# Patient Record
Sex: Male | Born: 1995 | Race: Black or African American | Hispanic: No | Marital: Single | Smoking: Never smoker
Health system: Southern US, Community
[De-identification: ages and names within clinical notes are randomized; demographics above are authoritative.]

## PROBLEM LIST (undated history)

## (undated) ENCOUNTER — Emergency Department: Admission: EM | Payer: Self-pay | Source: Home / Self Care

---
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
## 2018-03-02 ENCOUNTER — Emergency Department (HOSPITAL_BASED_OUTPATIENT_CLINIC_OR_DEPARTMENT_OTHER): Payer: Federal, State, Local not specified - PPO

## 2018-03-02 ENCOUNTER — Other Ambulatory Visit: Payer: Self-pay

## 2018-03-02 ENCOUNTER — Encounter (HOSPITAL_BASED_OUTPATIENT_CLINIC_OR_DEPARTMENT_OTHER): Payer: Self-pay | Admitting: *Deleted

## 2018-03-02 ENCOUNTER — Emergency Department (HOSPITAL_BASED_OUTPATIENT_CLINIC_OR_DEPARTMENT_OTHER)
Admission: EM | Admit: 2018-03-02 | Discharge: 2018-03-02 | Disposition: A | Discharge status: Home / Self Care | Payer: Federal, State, Local not specified - PPO | Attending: Emergency Medicine | Admitting: Emergency Medicine

## 2018-03-02 DIAGNOSIS — S199XXA Unspecified injury of neck, initial encounter: Secondary | ICD-10-CM | POA: Diagnosis present

## 2018-03-02 DIAGNOSIS — Y999 Unspecified external cause status: Secondary | ICD-10-CM | POA: Diagnosis not present

## 2018-03-02 DIAGNOSIS — Y92410 Unspecified street and highway as the place of occurrence of the external cause: Secondary | ICD-10-CM | POA: Insufficient documentation

## 2018-03-02 DIAGNOSIS — S161XXA Strain of muscle, fascia and tendon at neck level, initial encounter: Secondary | ICD-10-CM | POA: Diagnosis not present

## 2018-03-02 DIAGNOSIS — Y9389 Activity, other specified: Secondary | ICD-10-CM | POA: Insufficient documentation

## 2018-03-02 MED ORDER — NAPROXEN 500 MG PO TABS: 500.0000 mg | ORAL_TABLET | Freq: Two times a day (BID) | ORAL | 0 refills | Status: DC

## 2018-03-02 NOTE — ED Notes (Signed)
ED Provider at bedside. 
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
## 2018-03-02 NOTE — Discharge Instructions (Signed)
Work note provided.  Would expect the soreness in the neck to improve over 7 days if it does not she will need follow-up.  Take the Naprosyn on a regular basis.  Return for any new or worse symptoms.  CT scan of the neck without any bony injuries.

## 2018-03-02 NOTE — ED Notes (Signed)
Pt to CT at this time.

## 2018-06-24 ENCOUNTER — Other Ambulatory Visit: Payer: Self-pay

## 2018-06-24 ENCOUNTER — Encounter (HOSPITAL_BASED_OUTPATIENT_CLINIC_OR_DEPARTMENT_OTHER): Payer: Self-pay | Admitting: Adult Health

## 2018-06-24 ENCOUNTER — Emergency Department (HOSPITAL_BASED_OUTPATIENT_CLINIC_OR_DEPARTMENT_OTHER)
Admission: EM | Admit: 2018-06-24 | Discharge: 2018-06-24 | Disposition: A | Discharge status: Home / Self Care | Payer: Federal, State, Local not specified - PPO | Attending: Emergency Medicine | Admitting: Emergency Medicine

## 2018-06-24 DIAGNOSIS — Z79899 Other long term (current) drug therapy: Secondary | ICD-10-CM | POA: Diagnosis not present

## 2018-06-24 DIAGNOSIS — R040 Epistaxis: Secondary | ICD-10-CM | POA: Diagnosis not present

## 2018-06-24 DIAGNOSIS — F17220 Nicotine dependence, chewing tobacco, uncomplicated: Secondary | ICD-10-CM | POA: Diagnosis not present

## 2018-06-24 DIAGNOSIS — J3489 Other specified disorders of nose and nasal sinuses: Secondary | ICD-10-CM | POA: Diagnosis not present

## 2018-06-24 DIAGNOSIS — R0981 Nasal congestion: Secondary | ICD-10-CM | POA: Insufficient documentation

## 2018-06-24 DIAGNOSIS — H04209 Unspecified epiphora, unspecified lacrimal gland: Secondary | ICD-10-CM | POA: Insufficient documentation

## 2018-06-24 MED ORDER — SALINE SPRAY 0.65 % NA SOLN: 1.0000 | NASAL | 0 refills | Status: AC | PRN

## 2018-06-24 MED ORDER — CETIRIZINE HCL 10 MG PO TABS: 10.0000 mg | ORAL_TABLET | Freq: Every day | ORAL | 0 refills | Status: AC

## 2018-06-24 NOTE — Discharge Instructions (Addendum)
You were seen in the emergency department today for allergies.  We are sending you home with 2 prescriptions to help with this: Zyrtec (oral pill to take daily) and Nasal saline spray (spray to use in each nostril as needed 2-3 times per day).   We have prescribed you new medication(s) today. Discuss the medications prescribed today with your pharmacist as they can have adverse effects and interactions with your other medicines including over the counter and prescribed medications. Seek medical evaluation if you start to experience new or abnormal symptoms after taking one of these medicines, seek care immediately if you start to experience difficulty breathing, feeling of your throat closing, facial swelling, or rash as these could be indications of a more serious allergic reaction  Please follow-up with your primary care provider within 1 week for reevaluation.  If you do not have a primary care provider you may call the phone number in your discharge instructions.  Return to the ER for new or worsening symptoms or any other concerns.

## 2018-06-24 NOTE — ED Triage Notes (Signed)
Presents with allergies and a nose bleed. Nose not currently bleeding at thist ime. HE reprots that blood comes out when he blows his nose and he has a lot of congestion.

## 2018-06-24 NOTE — ED Provider Notes (Signed)
[facility] EMERGENCY DEPARTMENT Provider Note   CSN: [id number] Arrival date & time: 06/24/18  1012     History   Chief Complaint Chief Complaint  Patient presents with  . Nasal Congestion    HPI Jacob Frey is a 22 y.o. male with a history of tobacco abuse who presents to the emergency department with complaints of nasal congestion over the past 24 hours.  Patient states he feels his allergies are acting up, and he states he is having congestion, rhinorrhea, and watering of the eyes.  He states that when he blows his nose or digitally manipulates that he sometimes has some bleeding as well, this is brief and has resolved spontaneously each time. He has tried benadryl/allegra without significant improvement. He reports these have problems that are similar in the past and improved with an unknown pill and nasal spray.  States he has been outside a lot and has had skin allergy testing which has been positive for multiple plan/trees.  Denies fever, sore throat, ear pain, cough, trouble breathing, lightheadedness, or dizziness.   HPI  History reviewed. No pertinent past medical history.  There are no active problems to display for this patient.   History reviewed. No pertinent surgical history.      Home Medications    Prior to Admission medications   Medication Sig Start Date End Date Taking? Authorizing Provider  HYDROcodone-acetaminophen (NORCO/VICODIN) 5-325 MG per tablet Take 1 tablet by mouth every 6 (six) hours as needed. 01/21/14   Mellody Drown, PA-C  ibuprofen (ADVIL,MOTRIN) 800 MG tablet Take 1 tablet (800 mg total) by mouth 3 (three) times daily. 01/21/14   Mellody Drown, PA-C  naproxen (NAPROSYN) 500 MG tablet Take 1 tablet (500 mg total) by mouth 2 (two) times daily. 03/02/18   Vanetta Mulders, MD    Family History History reviewed. No pertinent family history.  Social History Social History   Tobacco Use  . Smoking status: Current Every Day Smoker    . Smokeless tobacco: Current User  Substance Use Topics  . Alcohol use: Yes  . Drug use: No     Allergies   Patient has no known allergies.   Review of Systems Review of Systems  Constitutional: Negative for chills and fever.  HENT: Positive for congestion, nosebleeds and rhinorrhea. Negative for ear pain and sore throat.   Respiratory: Negative for cough and shortness of breath.   Cardiovascular: Negative for chest pain.  Neurological: Negative for dizziness, syncope and light-headedness.     Physical Exam Updated Vital Signs BP 124/81   Pulse 76   Temp 98.1 F (36.7 C) (Oral)   Resp 18   Ht 5\' 10"  (1.778 m)   Wt 103.4 kg   SpO2 100%   BMI 32.71 kg/m   Physical Exam  Constitutional: He appears well-developed and well-nourished.  Non-toxic appearance. No distress.  HENT:  Head: Normocephalic and atraumatic.  Right Ear: Tympanic membrane normal. Tympanic membrane is not perforated, not erythematous, not retracted and not bulging.  Left Ear: Tympanic membrane normal. Tympanic membrane is not perforated, not erythematous, not retracted and not bulging.  Nose: Mucosal edema (boggy turbinates) present. No nasal septal hematoma. No epistaxis. Right sinus exhibits no maxillary sinus tenderness and no frontal sinus tenderness. Left sinus exhibits no maxillary sinus tenderness and no frontal sinus tenderness.  Mouth/Throat: Uvula is midline and oropharynx is clear and moist. No oropharyngeal exudate or posterior oropharyngeal erythema.  Eyes: Pupils are equal, round, and reactive to light. Conjunctivae  and EOM are normal. Right eye exhibits no discharge. Left eye exhibits no discharge.  Neck: Normal range of motion. Neck supple. No neck rigidity.  Cardiovascular: Normal rate and regular rhythm.  Pulmonary/Chest: Effort normal and breath sounds normal.  Lymphadenopathy:    He has no cervical adenopathy.  Neurological: He is alert.  Clear speech.   Psychiatric: He has a  normal mood and affect. His behavior is normal. Thought content normal.  Nursing note and vitals reviewed.    ED Treatments / Results  Labs (all labs ordered are listed, but only abnormal results are displayed) Labs Reviewed - No data to display  EKG None  Radiology No results found.  Procedures Procedures (including critical care time)  Medications Ordered in ED Medications - No data to display   Initial Impression / Assessment and Plan / ED Course  I have reviewed the triage vital signs and the nursing notes.  Pertinent labs & imaging results that were available during my care of the patient were reviewed by me and considered in my medical decision making (see chart for details).   Patient presents to the emergency department with complaints of congestion/rhinorrhea, intermittent mild nasal bleeding, and watering of the eyes which are consistent with prior issues with allergies. Patient nontoxic appearing, in no apparent distress, vitals WNL.  No active epistaxis on exam, suspect this is due to irrigation and digital manipulation. Additionally do not suspect acute bacterial conjunctivitis, acute bacterial sinusitis, AOM/EOM, or strep based on history and exam. Will trial zyrtec and nasal saline spray with PCP follow up. I discussed treatment plan, need for PCP follow-up, and return precautions with the patient. Provided opportunity for questions, patient confirmed understanding and is in agreement with plan.   Final Clinical Impressions(s) / ED Diagnoses   Final diagnoses:  Nasal congestion    ED Discharge Orders         Ordered    cetirizine (ZYRTEC ALLERGY) 10 MG tablet  Daily     06/24/18 1048    sodium chloride (OCEAN) 0.65 % SOLN nasal spray  As needed     06/24/18 1048           Almir Botts, Wheelwright R, PA-C 06/24/18 1049    Rolan Bucco, MD 06/24/18 1311

## 2018-11-22 ENCOUNTER — Encounter (HOSPITAL_BASED_OUTPATIENT_CLINIC_OR_DEPARTMENT_OTHER): Payer: Self-pay

## 2018-11-22 ENCOUNTER — Other Ambulatory Visit: Payer: Self-pay

## 2018-11-22 ENCOUNTER — Emergency Department (HOSPITAL_BASED_OUTPATIENT_CLINIC_OR_DEPARTMENT_OTHER)
Admission: EM | Admit: 2018-11-22 | Discharge: 2018-11-22 | Disposition: A | Discharge status: Home / Self Care | Payer: Federal, State, Local not specified - PPO | Attending: Emergency Medicine | Admitting: Emergency Medicine

## 2018-11-22 DIAGNOSIS — R05 Cough: Secondary | ICD-10-CM | POA: Diagnosis present

## 2018-11-22 DIAGNOSIS — J069 Acute upper respiratory infection, unspecified: Secondary | ICD-10-CM | POA: Insufficient documentation

## 2018-11-22 DIAGNOSIS — F17290 Nicotine dependence, other tobacco product, uncomplicated: Secondary | ICD-10-CM | POA: Insufficient documentation

## 2018-11-22 DIAGNOSIS — Z79899 Other long term (current) drug therapy: Secondary | ICD-10-CM | POA: Diagnosis not present

## 2018-11-22 DIAGNOSIS — B9789 Other viral agents as the cause of diseases classified elsewhere: Secondary | ICD-10-CM

## 2018-11-22 MED ORDER — GUAIFENESIN ER 1200 MG PO TB12: 1.0000 | ORAL_TABLET | Freq: Two times a day (BID) | ORAL | 0 refills | Status: AC

## 2018-11-22 MED ORDER — PREDNISONE 50 MG PO TABS: 50.0000 mg | ORAL_TABLET | Freq: Every day | ORAL | 0 refills | Status: AC

## 2018-11-22 MED ORDER — PROMETHAZINE-DM 6.25-15 MG/5ML PO SYRP: 5.0000 mL | ORAL_SOLUTION | Freq: Four times a day (QID) | ORAL | 0 refills | Status: AC | PRN

## 2018-11-22 MED ORDER — BUDESONIDE 32 MCG/ACT NA SUSP: 2.0000 | Freq: Every day | NASAL | 0 refills | Status: AC

## 2018-11-22 NOTE — ED Triage Notes (Addendum)
C/o URI sx x 4 days with sinus congestion-sore throat earlier denies at present-NAD-steady gait

## 2018-11-22 NOTE — Discharge Instructions (Addendum)
Return here as needed. Follow up with a primary doctor. Increase your fluid intake. °

## 2018-11-22 NOTE — ED Provider Notes (Signed)
[facility] EMERGENCY DEPARTMENT Provider Note   CSN: [id number] Arrival date & time: 11/22/18  1236    History   Chief Complaint Chief Complaint  Patient presents with  . URI    HPI Jacob Frey is a 23 y.o. male.     HPI Patient presents to the emergency department with nasal congestion cough and watery eyes over the last 4 days.  Patient states that he did take some over-the-counter Mucinex along with nasal spray.  He states that his symptoms have not dramatically improved.  Patient denies chest pain, shortness of breath, nausea, vomiting, weakness, dizziness, blurred vision, back pain, near-syncope or syncope.  History reviewed. No pertinent past medical history.  There are no active problems to display for this patient.   History reviewed. No pertinent surgical history.      Home Medications    Prior to Admission medications   Medication Sig Start Date End Date Taking? Authorizing Provider  cetirizine (ZYRTEC ALLERGY) 10 MG tablet Take 1 tablet (10 mg total) by mouth daily. 06/24/18   Petrucelli, Samantha R, PA-C  sodium chloride (OCEAN) 0.65 % SOLN nasal spray Place 1 spray into both nostrils as needed for congestion. 06/24/18   Petrucelli, Pleas Koch, PA-C    Family History No family history on file.  Social History Social History   Tobacco Use  . Smoking status: Current Every Day Smoker    Types: Cigars  . Smokeless tobacco: Never Used  Substance Use Topics  . Alcohol use: Not Currently  . Drug use: No     Allergies   Peanut-containing drug products   Review of Systems Review of Systems All other systems negative except as documented in the HPI. All pertinent positives and negatives as reviewed in the HPI.  Physical Exam Updated Vital Signs BP 130/82 (BP Location: Right Arm)   Pulse 80   Temp 98.3 F (36.8 C) (Oral)   Resp 14   Ht 5\' 11"  (1.803 m)   Wt 95.3 kg   SpO2 100%   BMI 29.29 kg/m   Physical Exam Vitals signs and  nursing note reviewed.  Constitutional:      General: He is not in acute distress.    Appearance: He is well-developed.  HENT:     Head: Normocephalic and atraumatic.     Right Ear: Tympanic membrane normal.     Left Ear: Tympanic membrane normal.     Nose: Congestion and rhinorrhea present.     Mouth/Throat:     Mouth: Mucous membranes are moist.     Pharynx: No oropharyngeal exudate or posterior oropharyngeal erythema.  Eyes:     Pupils: Pupils are equal, round, and reactive to light.  Cardiovascular:     Rate and Rhythm: Normal rate and regular rhythm.     Pulses: Normal pulses.     Heart sounds: Normal heart sounds. No murmur. No friction rub. No gallop.   Pulmonary:     Effort: Pulmonary effort is normal. No respiratory distress.     Breath sounds: Normal breath sounds. No wheezing or rhonchi.  Skin:    General: Skin is warm and dry.  Neurological:     Mental Status: He is alert and oriented to person, place, and time.      ED Treatments / Results  Labs (all labs ordered are listed, but only abnormal results are displayed) Labs Reviewed - No data to display  EKG None  Radiology No results found.  Procedures Procedures (including critical care  time)  Medications Ordered in ED Medications - No data to display   Initial Impression / Assessment and Plan / ED Course  I have reviewed the triage vital signs and the nursing notes.  Pertinent labs & imaging results that were available during my care of the patient were reviewed by me and considered in my medical decision making (see chart for details).        Patient will be treated for URI with cough.  Have advised the patient to return here as needed.  Patient is advised to increase his fluid intake and rest as much as possible. Final Clinical Impressions(s) / ED Diagnoses   Final diagnoses:  None    ED Discharge Orders    None       Charlestine Night, PA-C 11/22/18 1451    Rolan Bucco,  MD 11/22/18 1535

## 2020-10-19 ENCOUNTER — Emergency Department (HOSPITAL_BASED_OUTPATIENT_CLINIC_OR_DEPARTMENT_OTHER): Payer: Federal, State, Local not specified - PPO

## 2020-10-19 ENCOUNTER — Emergency Department (HOSPITAL_BASED_OUTPATIENT_CLINIC_OR_DEPARTMENT_OTHER)
Admission: EM | Admit: 2020-10-19 | Discharge: 2020-10-19 | Disposition: A | Discharge status: Home / Self Care | Payer: Federal, State, Local not specified - PPO | Attending: Emergency Medicine | Admitting: Emergency Medicine

## 2020-10-19 ENCOUNTER — Other Ambulatory Visit: Payer: Self-pay

## 2020-10-19 ENCOUNTER — Encounter (HOSPITAL_BASED_OUTPATIENT_CLINIC_OR_DEPARTMENT_OTHER): Payer: Self-pay | Admitting: Emergency Medicine

## 2020-10-19 DIAGNOSIS — S6991XA Unspecified injury of right wrist, hand and finger(s), initial encounter: Secondary | ICD-10-CM | POA: Diagnosis present

## 2020-10-19 DIAGNOSIS — Z91010 Allergy to peanuts: Secondary | ICD-10-CM | POA: Insufficient documentation

## 2020-10-19 DIAGNOSIS — Y9361 Activity, american tackle football: Secondary | ICD-10-CM | POA: Diagnosis not present

## 2020-10-19 DIAGNOSIS — S63501A Unspecified sprain of right wrist, initial encounter: Secondary | ICD-10-CM | POA: Diagnosis not present

## 2020-10-19 DIAGNOSIS — W1839XA Other fall on same level, initial encounter: Secondary | ICD-10-CM | POA: Insufficient documentation

## 2020-10-19 DIAGNOSIS — F17290 Nicotine dependence, other tobacco product, uncomplicated: Secondary | ICD-10-CM | POA: Diagnosis not present

## 2020-10-19 NOTE — Discharge Instructions (Addendum)
Follow-up with sports medicine for further treatment of the wrist.

## 2020-10-19 NOTE — ED Notes (Signed)
Pt discharged to home. Discharge instructions have been discussed with patient and/or family members. Pt verbally acknowledges understanding d/c instructions, and endorses comprehension to checkout at registration before leaving.  °

## 2020-10-19 NOTE — ED Provider Notes (Signed)
[facility] EMERGENCY DEPARTMENT Provider Note   CSN: [id number] Arrival date & time: 10/19/20  0857     History Chief Complaint  Patient presents with  . Wrist Injury    Jacob Frey is a 25 y.o. male.  HPI Patient presents with right wrist injury.  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  States he fell on it during practice.  Pain to the medial aspect of the wrist.  States some swelling the wrist.  Pain with movement.  No other injury.  States there was multiple falls where he did land on the wrist.  No numbness weakness.  No elbow pain.  No shoulder pain.  Skin intact.    History reviewed. No pertinent past medical history.  There are no problems to display for this patient.   History reviewed. No pertinent surgical history.     History reviewed. No pertinent family history.  Social History   Tobacco Use  . Smoking status: Current Every Day Smoker    Types: Cigars  . Smokeless tobacco: Never Used  Vaping Use  . Vaping Use: Every day  Substance Use Topics  . Alcohol use: Not Currently  . Drug use: No    Home Medications Prior to Admission medications   Medication Sig Start Date End Date Taking? Authorizing Provider  budesonide (RHINOCORT AQUA) 32 MCG/ACT nasal spray Place 2 sprays into both nostrils daily. 11/22/18   Lawyer, Cristal Deer, PA-C  cetirizine (ZYRTEC ALLERGY) 10 MG tablet Take 1 tablet (10 mg total) by mouth daily. 06/24/18   Petrucelli, Samantha R, PA-C  Guaifenesin 1200 MG TB12 Take 1 tablet (1,200 mg total) by mouth 2 (two) times daily. 11/22/18   Lawyer, Cristal Deer, PA-C  predniSONE (DELTASONE) 50 MG tablet Take 1 tablet (50 mg total) by mouth daily. 11/22/18   Lawyer, Cristal Deer, PA-C  promethazine-dextromethorphan (PROMETHAZINE-DM) 6.25-15 MG/5ML syrup Take 5 mLs by mouth 4 (four) times daily as needed for cough. 11/22/18   Lawyer, Cristal Deer, PA-C  sodium chloride (OCEAN) 0.65 % SOLN nasal spray Place 1 spray into both nostrils as needed for  congestion. 06/24/18   Petrucelli, Samantha R, PA-C    Allergies    Peanut-containing drug products  Review of Systems   Review of Systems  Constitutional: Negative for appetite change.  Respiratory: Negative for shortness of breath.   Cardiovascular: Negative for chest pain.  Gastrointestinal: Negative for abdominal pain.  Genitourinary: Negative for flank pain.  Musculoskeletal:       Right wrist pain.  Skin: Negative for wound.  Neurological: Negative for weakness and numbness.    Physical Exam Updated Vital Signs BP 117/70 (BP Location: Left Arm)   Pulse 83   Temp 98.5 F (36.9 C) (Oral)   Resp 18   Ht 5\' 10"  (1.778 m)   Wt 115.7 kg   SpO2 100%   BMI 36.59 kg/m   Physical Exam Vitals and nursing note reviewed.  Constitutional:      Appearance: Normal appearance.  HENT:     Head: Atraumatic.  Musculoskeletal:     Comments: Tenderness medially over right wrist.  No deformity.  Good range of motion.  No tenderness over snuffbox.  Sensation grossly intact over hand.  Good capillary refill.  No tenderness over elbow or shoulder.  Strong radial pulse.  No tenderness over hand.  Neurological:     Mental Status: He is alert. Mental status is at baseline.     ED Results / Procedures / Treatments   Labs (all labs ordered  are listed, but only abnormal results are displayed) Labs Reviewed - No data to display  EKG None  Radiology DG Wrist Complete Right  Result Date: 10/19/2020 CLINICAL DATA:  Fall with pain on the ulnar side of the wrist. EXAM: RIGHT WRIST - COMPLETE 3+ VIEW COMPARISON:  Forearm radiographs dated 04/08/2017. FINDINGS: There is no evidence of fracture or dislocation. There is no evidence of arthropathy or other focal bone abnormality. Soft tissues are unremarkable. IMPRESSION: Negative. Electronically Signed   By: Romona Curls M.D.   On: 10/19/2020 10:13    Procedures Procedures   Medications Ordered in ED Medications - No data to display  ED  Course  I have reviewed the triage vital signs and the nursing notes.  Pertinent labs & imaging results that were available during my care of the patient were reviewed by me and considered in my medical decision making (see chart for details).    MDM Rules/Calculators/A&P                          Patient with right-sided wrist injury. Medial pain. X-ray reassuring. No other injury. Will immobilize with Velcro splint. Sports medicine follow-up as needed. Doubt occult fracture. Final Clinical Impression(s) / ED Diagnoses Final diagnoses:  Wrist sprain, right, initial encounter    Rx / DC Orders ED Discharge Orders    None       Benjiman Core, MD 10/19/20 1032

## 2020-10-19 NOTE — ED Triage Notes (Signed)
Pt arrives pov with c/o right wrist injury yesterday after falling on hand while playing football. Swelling noted

## 2020-10-19 NOTE — ED Notes (Signed)
Pt. returned from XR. 

## 2020-10-28 ENCOUNTER — Telehealth: Payer: Self-pay | Admitting: Family Medicine

## 2020-10-28 NOTE — Telephone Encounter (Signed)
Unsuccessful attempt to contact patient offering an ED f/u --no answer & unable to leave message-- Will cb later.  --glh

## 2021-01-10 ENCOUNTER — Encounter (HOSPITAL_BASED_OUTPATIENT_CLINIC_OR_DEPARTMENT_OTHER): Payer: Self-pay | Admitting: Emergency Medicine

## 2021-01-10 ENCOUNTER — Other Ambulatory Visit: Payer: Self-pay

## 2021-01-10 ENCOUNTER — Emergency Department (HOSPITAL_BASED_OUTPATIENT_CLINIC_OR_DEPARTMENT_OTHER)
Admission: EM | Admit: 2021-01-10 | Discharge: 2021-01-10 | Disposition: A | Discharge status: Home / Self Care | Payer: Federal, State, Local not specified - PPO | Attending: Emergency Medicine | Admitting: Emergency Medicine

## 2021-01-10 ENCOUNTER — Emergency Department (HOSPITAL_BASED_OUTPATIENT_CLINIC_OR_DEPARTMENT_OTHER): Payer: Federal, State, Local not specified - PPO

## 2021-01-10 DIAGNOSIS — F17290 Nicotine dependence, other tobacco product, uncomplicated: Secondary | ICD-10-CM | POA: Insufficient documentation

## 2021-01-10 DIAGNOSIS — S8001XA Contusion of right knee, initial encounter: Secondary | ICD-10-CM

## 2021-01-10 DIAGNOSIS — S8991XA Unspecified injury of right lower leg, initial encounter: Secondary | ICD-10-CM | POA: Diagnosis not present

## 2021-01-10 DIAGNOSIS — Z91010 Allergy to peanuts: Secondary | ICD-10-CM | POA: Insufficient documentation

## 2021-01-10 DIAGNOSIS — Y9361 Activity, american tackle football: Secondary | ICD-10-CM | POA: Insufficient documentation

## 2021-01-10 DIAGNOSIS — W500XXA Accidental hit or strike by another person, initial encounter: Secondary | ICD-10-CM | POA: Insufficient documentation

## 2021-01-10 MED ORDER — IBUPROFEN 800 MG PO TABS
ORAL_TABLET | ORAL | Status: AC
  Filled 2021-01-10: qty 1

## 2021-01-10 MED ORDER — IBUPROFEN 800 MG PO TABS
800.0000 mg | ORAL_TABLET | Freq: Once | ORAL | Status: AC
  Administered 2021-01-10: 800 mg via ORAL
  Filled 2021-01-10: qty 1
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
## 2021-01-10 NOTE — ED Notes (Signed)
ED Provider at bedside. Dr. Yao 
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
## 2021-01-10 NOTE — Discharge Instructions (Signed)
Take Tylenol or Motrin for pain.   Wear knee sleeve for comfort and use crutches  See your doctor for follow-up  Return to ER if you have worse knee pain or swelling, trouble walking.

## 2021-08-21 ENCOUNTER — Other Ambulatory Visit: Payer: Self-pay

## 2021-08-21 ENCOUNTER — Emergency Department (HOSPITAL_BASED_OUTPATIENT_CLINIC_OR_DEPARTMENT_OTHER)
Admission: EM | Admit: 2021-08-21 | Discharge: 2021-08-21 | Disposition: A | Discharge status: Home / Self Care | Payer: Federal, State, Local not specified - PPO | Attending: Emergency Medicine | Admitting: Emergency Medicine

## 2021-08-21 ENCOUNTER — Encounter (HOSPITAL_BASED_OUTPATIENT_CLINIC_OR_DEPARTMENT_OTHER): Payer: Self-pay

## 2021-08-21 DIAGNOSIS — R220 Localized swelling, mass and lump, head: Secondary | ICD-10-CM | POA: Diagnosis present

## 2021-08-21 DIAGNOSIS — L0201 Cutaneous abscess of face: Secondary | ICD-10-CM | POA: Insufficient documentation

## 2021-08-21 DIAGNOSIS — Z91010 Allergy to peanuts: Secondary | ICD-10-CM | POA: Insufficient documentation

## 2021-08-21 MED ORDER — DOXYCYCLINE HYCLATE 100 MG PO CAPS: 100.0000 mg | ORAL_CAPSULE | Freq: Two times a day (BID) | ORAL | 0 refills | Status: DC

## 2021-08-21 NOTE — ED Provider Notes (Signed)
[facility] EMERGENCY DEPARTMENT Provider Note   CSN: [id number] Arrival date & time: 08/21/21  3335     History Chief Complaint  Patient presents with   Oral Swelling    Jacob Frey is a 25 y.o. male.  HPI Patient presents with left chin/facial swelling.  Began couple days ago.  States he thinks he could have had an ingrown hair and had pulled some around it but did not really help.  States it has been some drainage.  No fevers.  States it is more swollen now when he is more worried.  Also worried he could have had a bug bite.  Patient is not diabetic.  No difficulty swallowing.    History reviewed. No pertinent past medical history.  There are no problems to display for this patient.   History reviewed. No pertinent surgical history.     No family history on file.  Social History   Tobacco Use   Smoking status: Never   Smokeless tobacco: Never  Vaping Use   Vaping Use: Every day  Substance Use Topics   Alcohol use: Not Currently   Drug use: No    Home Medications Prior to Admission medications   Medication Sig Start Date End Date Taking? Authorizing Provider  doxycycline (VIBRAMYCIN) 100 MG capsule Take 1 capsule (100 mg total) by mouth 2 (two) times daily. 08/21/21  Yes Benjiman Core, MD  budesonide (RHINOCORT AQUA) 32 MCG/ACT nasal spray Place 2 sprays into both nostrils daily. 11/22/18   Lawyer, Cristal Deer, PA-C  cetirizine (ZYRTEC ALLERGY) 10 MG tablet Take 1 tablet (10 mg total) by mouth daily. 06/24/18   Petrucelli, Samantha R, PA-C  Guaifenesin 1200 MG TB12 Take 1 tablet (1,200 mg total) by mouth 2 (two) times daily. 11/22/18   Lawyer, Cristal Deer, PA-C  predniSONE (DELTASONE) 50 MG tablet Take 1 tablet (50 mg total) by mouth daily. 11/22/18   Lawyer, Cristal Deer, PA-C  promethazine-dextromethorphan (PROMETHAZINE-DM) 6.25-15 MG/5ML syrup Take 5 mLs by mouth 4 (four) times daily as needed for cough. 11/22/18   Lawyer, Cristal Deer, PA-C  sodium chloride  (OCEAN) 0.65 % SOLN nasal spray Place 1 spray into both nostrils as needed for congestion. 06/24/18   Petrucelli, Samantha R, PA-C    Allergies    Peanut-containing drug products  Review of Systems   Review of Systems  Constitutional:  Negative for appetite change and fever.  HENT:  Positive for facial swelling. Negative for congestion and dental problem.   Respiratory:  Negative for shortness of breath.   Cardiovascular:  Negative for chest pain.  Neurological:  Negative for weakness.   Physical Exam Updated Vital Signs BP (!) 141/77 (BP Location: Right Arm)   Pulse 71   Temp 98.5 F (36.9 C) (Oral)   Resp 16   Ht 5\' 11"  (1.803 m)   Wt 111.1 kg   SpO2 98%   BMI 34.17 kg/m   Physical Exam Vitals and nursing note reviewed.  HENT:     Head: Atraumatic.     Mouth/Throat:     Comments: No dental tenderness.  Swelling of left anterior lateral jaw.  There is 1 area with a hole there is some purulent drainage with palpation.  Some fluctuance.  Indurated area is approximately 2 cm x 4 cm. Musculoskeletal:        General: No tenderness.  Skin:    General: Skin is warm.     Capillary Refill: Capillary refill takes less than 2 seconds.  Neurological:  Mental Status: He is alert and oriented to person, place, and time.    ED Results / Procedures / Treatments   Labs (all labs ordered are listed, but only abnormal results are displayed) Labs Reviewed - No data to display  EKG None  Radiology No results found.  Procedures Procedures   Medications Ordered in ED Medications - No data to display  ED Course  I have reviewed the triage vital signs and the nursing notes.  Pertinent labs & imaging results that were available during my care of the patient were reviewed by me and considered in my medical decision making (see chart for details).    MDM Rules/Calculators/A&P                           Patient with apparent facial abscess.  Has had for couple days.  Does  have some drainage already.  Already had a hole likely at the site of the hair.  This was slightly enlarged using an 18-gauge needle.  Purulence had been expressed.  We will treat with antibiotics.  No apparent dental or floor mouth involvement.  Well-appearing.  Discharge home with outpatient follow-up as needed. Final Clinical Impression(s) / ED Diagnoses Final diagnoses:  Facial abscess    Rx / DC Orders ED Discharge Orders          Ordered    doxycycline (VIBRAMYCIN) 100 MG capsule  2 times daily        08/21/21 0805             Benjiman Core, MD 08/21/21 [phone]

## 2021-08-21 NOTE — ED Triage Notes (Signed)
Pt presents with swelling to left lower lip for 2 days. Noted to have red bump below swelling located on chin

## 2021-08-21 NOTE — Discharge Instructions (Signed)
Watch for worsening infection.  Watch for fevers.  Watch for spreading.  Follow-up with your PCP or the ER for worsening symptoms

## 2021-11-15 ENCOUNTER — Other Ambulatory Visit: Payer: Self-pay

## 2021-11-15 ENCOUNTER — Emergency Department (HOSPITAL_BASED_OUTPATIENT_CLINIC_OR_DEPARTMENT_OTHER)
Admission: EM | Admit: 2021-11-15 | Discharge: 2021-11-15 | Disposition: A | Discharge status: Home / Self Care | Payer: Federal, State, Local not specified - PPO | Attending: Emergency Medicine | Admitting: Emergency Medicine

## 2021-11-15 ENCOUNTER — Encounter (HOSPITAL_BASED_OUTPATIENT_CLINIC_OR_DEPARTMENT_OTHER): Payer: Self-pay | Admitting: Emergency Medicine

## 2021-11-15 DIAGNOSIS — R369 Urethral discharge, unspecified: Secondary | ICD-10-CM

## 2021-11-15 DIAGNOSIS — Z91010 Allergy to peanuts: Secondary | ICD-10-CM | POA: Insufficient documentation

## 2021-11-15 LAB — URINALYSIS, ROUTINE W REFLEX MICROSCOPIC
Bilirubin Urine: NEGATIVE
Glucose, UA: NEGATIVE mg/dL
Ketones, ur: NEGATIVE mg/dL
Nitrite: NEGATIVE
Protein, ur: NEGATIVE mg/dL
Specific Gravity, Urine: >=1.03 (ref 1.005–1.030)
pH: 6 (ref 5.0–8.0)

## 2021-11-15 LAB — URINALYSIS, MICROSCOPIC (REFLEX)

## 2021-11-15 LAB — CBG MONITORING, ED: Glucose-Capillary: 98 mg/dL (ref 70–99)

## 2021-11-15 MED ORDER — LIDOCAINE HCL (PF) 1 % IJ SOLN
1.0000 mL | Freq: Once | INTRAMUSCULAR | Status: AC
  Administered 2021-11-15: 1 mL
  Filled 2021-11-15: qty 5

## 2021-11-15 MED ORDER — CEFTRIAXONE SODIUM 500 MG IJ SOLR
500.0000 mg | Freq: Once | INTRAMUSCULAR | Status: AC
  Administered 2021-11-15: 500 mg via INTRAMUSCULAR
  Filled 2021-11-15: qty 500

## 2021-11-15 MED ORDER — DOXYCYCLINE HYCLATE 100 MG PO TABS: 100.0000 mg | ORAL_TABLET | Freq: Two times a day (BID) | ORAL | 0 refills | Status: AC

## 2021-11-15 NOTE — Discharge Instructions (Addendum)
You have been treated in the emergency department for an infection, possibly sexually transmitted. Results of your gonorrhea and chlamydia tests are pending and you will be notified if they are positive. Please refrain from intercourse for 7 days and until all sex partners (within previous 60 days) are evaluated and/or treated as well. Please follow up with your primary care provider for continued care and further STD evaluation.  It is very important to practice safe sex and use condoms when sexually active. If your results are positive you need to notify all sexual partners so they can be treated as well. The website http://www.dontspreadit.com/ can be used to send anonymous text messages or emails to alert sexual contacts. Follow up with your doctor, or OBGYN in regards to today's visit.   Gonorrhea and Chlamydia SYMPTOMS  In females, symptoms may go unnoticed. Symptoms that are more noticeable can include:  Belly (abdominal) pain.  Painful intercourse.  Watery mucous-like discharge from the vagina.  Miscarriage.  Discomfort when urinating.  Inflammation of the rectum.  Abnormal gray-green frothy vaginal discharge  Vaginal itching and irritatio  Itching and irritation of the area outside the vagina.   Painful urination.  Bleeding after sexual intercourse.  In males, symptoms include:  Burning with urination.  Pain in the testicles.  Watery mucous-like discharge from the penis.  It can cause longstanding (chronic) pelvic pain after frequent infections.  TREATMENT  PID can cause women to not be able to have children (sterile) if left untreated or if half-treated.  It is important to finish ALL medications given to you.  This is a sexually transmitted infection. So you are also at risk for other sexually transmitted diseases, including HIV (AIDS), it is recommended that you get tested. HOME CARE INSTRUCTIONS  Warning: This infection is contagious. Do not have sex until treatment is  completed. Follow up at your caregiver's office or the clinic to which you were referred. If your diagnosis (learning what is wrong) is confirmed by culture or some other method, your recent sexual contacts need treatment. Even if they are symptom free or have a negative culture or evaluation, they should be treated.  PREVENTION  Women should use sanitary pads instead of tampons for vaginal discharge.  Wipe front to back after using the toilet and avoid douching.   Practice safe sex, use condoms, have only one sex partner and be sure your sex partner is not having sex with others.  Ask your caregiver to test you for chlamydia at your regular checkups or sooner if you are having symptoms.  Ask for further information if you are pregnant.  SEEK IMMEDIATE MEDICAL CARE IF:  You develop an oral temperature above 102 F (38.9 C), not controlled by medications or lasting more than 2 days.  You develop an increase in pain.  You develop any type of abnormal discharge.  You develop vaginal bleeding and it is not time for your period.  You develop painful intercourse.   Bacterial Vaginosis  Bacterial vaginosis (BV) is a vaginal infection where the normal balance of bacteria in the vagina is disrupted. This is not a sexually transmitted disease and your sexual partners do NOT need to be treated. CAUSES  The cause of BV is not fully understood. BV develops when there is an increase or imbalance of harmful bacteria.  Some activities or behaviors can upset the normal balance of bacteria in the vagina and put women at increased risk including:  Having a new sex partner or multiple   sex partners.  Douching.  Using an intrauterine device (IUD) for contraception.  It is not clear what role sexual activity plays in the development of BV. However, women that have never had sexual intercourse are rarely infected with BV.  Women do not get BV from toilet seats, bedding, swimming pools or from touching objects around  them.   SYMPTOMS  Grey vaginal discharge.  A fish-like odor with discharge, especially after sexual intercourse.  Itching or burning of the vagina and vulva.  Burning or pain with urination.  Some women have no signs or symptoms at all.   TREATMENT  Sometimes BV will clear up without treatment.  BV may be treated with antibiotics.  BV can recur after treatment. If this happens, a second round of antibiotics will often be prescribed.  HOME CARE INSTRUCTIONS  Finish all medication as directed by your caregiver.  Do not have sex until treatment is completed.  Do NOT drink any alcoholic beverages while being treated  with Metronidazole (Flagyl). This will cause a severe reaction inducing vomiting.  RESOURCE GUIDE  Dental Problems  Patients with Medicaid: [facility]                     [facility] [address]                                           [address] Phone:  [phone]                                                  Phone:  [phone]  If unable to pay or uninsured, contact:  [facility] or [facility] to become qualified for the adult dental clinic.  Chronic Pain Problems Contact [facility]  [phone] Patients need to be referred by their primary care doctor.  Insufficient Money for Medicine Contact [employer]:  call "211" or [facility] 271-5999.  No Primary Care Doctor Call [facility]  832-8000 Other agencies that provide inexpensive medical care    [facility]  832-8035    [facility]  [phone]    [facility]  [phone]    [facility] Clinic  [phone]    [facility]  [phone]    [facility]  [phone]  Psychological Services [facility]  [phone] [facility]  [phone] [facility]   800 853-5163 (emergency services 641-4993)  Substance Abuse Resources [facility]   336-882-2125 [facility] 336-784-9470 [facility] 336-285-9073 [facility] 336-845-3988 Residential & Outpatient Substance Abuse Program  800-659-3381  Abuse/Neglect [place] Child Abuse Hotline (336) 641-3795 [place] Child Abuse Hotline 800-378-5315 (After Hours)  Emergency Shelter [facility] (336) 271-5985  [facility] (336) 275-9566 [facility] (704) 372-4663  MRSA Hotline #:   832-7006    [place] [facility]  [facility] [place]     [employer]                          [facility] [address]   [place]                       [address]      [address]  [place]                                                [place]                            [place] Phone:  [phone]                                   Phone:  [phone]                 Phone:  [phone]  [facility] Phone:  [phone]  [place] Child Abuse Hotline (336) 342-1394 (336) 342-3537 (After Hours)   

## 2021-11-15 NOTE — ED Triage Notes (Signed)
Pt arrives pov with c/o dysuria and penile d/c today. Endorses unprotected sex, denies fever

## 2021-11-15 NOTE — ED Provider Notes (Signed)
[facility] EMERGENCY DEPARTMENT Provider Note   CSN: [id number] Arrival date & time: 11/15/21  1103     History  Chief Complaint  Patient presents with   Dysuria   Penile Discharge    Jacob Frey is a 26 y.o. male.   Dysuria Presenting symptoms: dysuria and penile discharge   Penile Discharge  Patient has penile discharge that he noticed this morning.  He states he does have history of unprotected sex.  No penile pain no lesions or bleeding.  Denies any fevers chills cough congestion lightheadedness dizziness.    Home Medications Prior to Admission medications   Medication Sig Start Date End Date Taking? Authorizing Provider  doxycycline (VIBRA-TABS) 100 MG tablet Take 1 tablet (100 mg total) by mouth 2 (two) times daily for 7 days. 11/15/21 11/22/21 Yes Michalle Rademaker S, PA  budesonide (RHINOCORT AQUA) 32 MCG/ACT nasal spray Place 2 sprays into both nostrils daily. 11/22/18   Lawyer, Cristal Deer, PA-C  cetirizine (ZYRTEC ALLERGY) 10 MG tablet Take 1 tablet (10 mg total) by mouth daily. 06/24/18   Petrucelli, Samantha R, PA-C  Guaifenesin 1200 MG TB12 Take 1 tablet (1,200 mg total) by mouth 2 (two) times daily. 11/22/18   Lawyer, Cristal Deer, PA-C  predniSONE (DELTASONE) 50 MG tablet Take 1 tablet (50 mg total) by mouth daily. 11/22/18   Lawyer, Cristal Deer, PA-C  promethazine-dextromethorphan (PROMETHAZINE-DM) 6.25-15 MG/5ML syrup Take 5 mLs by mouth 4 (four) times daily as needed for cough. 11/22/18   Lawyer, Cristal Deer, PA-C  sodium chloride (OCEAN) 0.65 % SOLN nasal spray Place 1 spray into both nostrils as needed for congestion. 06/24/18   Petrucelli, Samantha R, PA-C      Allergies    Peanut-containing drug products    Review of Systems   Review of Systems  Genitourinary:  Positive for dysuria and penile discharge.   Physical Exam Updated Vital Signs BP 122/79 (BP Location: Left Arm)    Pulse 90    Temp 98.6 F (37 C) (Oral)    Resp 18    Ht 5\' 11"  (1.803 m)     Wt 113.4 kg    SpO2 100%    BMI 34.87 kg/m  Physical Exam Vitals and nursing note reviewed.  Constitutional:      General: He is not in acute distress.    Appearance: Normal appearance. He is not ill-appearing.  HENT:     Head: Normocephalic and atraumatic.  Eyes:     General: No scleral icterus.       Right eye: No discharge.        Left eye: No discharge.     Conjunctiva/sclera: Conjunctivae normal.  Pulmonary:     Effort: Pulmonary effort is normal.     Breath sounds: No stridor.  Genitourinary:    Comments: Declines Neurological:     Mental Status: He is alert and oriented to person, place, and time. Mental status is at baseline.    ED Results / Procedures / Treatments   Labs (all labs ordered are listed, but only abnormal results are displayed) Labs Reviewed  URINALYSIS, ROUTINE W REFLEX MICROSCOPIC - Abnormal; Notable for the following components:      Result Value   APPearance HAZY (*)    Hgb urine dipstick TRACE (*)    Leukocytes,Ua SMALL (*)    All other components within normal limits  URINALYSIS, MICROSCOPIC (REFLEX) - Abnormal; Notable for the following components:   Bacteria, UA MANY (*)    All other components within normal  limits  CBG MONITORING, ED  GC/CHLAMYDIA PROBE AMP ([place]) NOT AT [facility]    EKG None  Radiology No results found.  Procedures Procedures    Medications Ordered in ED Medications  cefTRIAXone (ROCEPHIN) injection 500 mg (500 mg Intramuscular Given 11/15/21 1233)  lidocaine (PF) (XYLOCAINE) 1 % injection 1 mL (1 mL Other Given 11/15/21 1237)    ED Course/ Medical Decision Making/ A&P                           Medical Decision Making Amount and/or Complexity of Data Reviewed Labs: ordered.  Risk Prescription drug management.    Patient is a 26 year old male presented emergency room today with complaints of penile discharge seems that he has had some burning at the tip of his penis as well his symptoms started today.   He says he has unprotected sex occasionally most recently approximately 1 week ago.  No abdominal pain no lesions he declined physical exam but well appearing on fully clothed exam.  He does not appear uncomfortable.  We will treat with Rocephin and doxycycline.  Empiric treatment initiated.  We do lengthy educational discussion about STDs.  Healthy sex and need for partners to be tested.  He understands he will need to complete 2 weeks of abstinence and take the entire course of doxycycline even if his symptoms improve.  He will follow-up with the health department for further testing. He understands HIV/RPR etc will need to be tested at [facility]  Final Clinical Impression(s) / ED Diagnoses Final diagnoses:  Penile discharge    Rx / DC Orders ED Discharge Orders          Ordered    doxycycline (VIBRA-TABS) 100 MG tablet  2 times daily        11/15/21 1219              Solon [place], [place] 11/15/21 1441    Gwyneth Sprout, MD 11/17/21 0840

## 2021-11-16 LAB — GC/CHLAMYDIA PROBE AMP (~~LOC~~) NOT AT ARMC
Chlamydia: NEGATIVE
Comment: NEGATIVE
Comment: NORMAL
Neisseria Gonorrhea: POSITIVE — AB

## 2021-11-23 ENCOUNTER — Emergency Department (HOSPITAL_BASED_OUTPATIENT_CLINIC_OR_DEPARTMENT_OTHER)
Admission: EM | Admit: 2021-11-23 | Discharge: 2021-11-23 | Disposition: A | Discharge status: Home / Self Care | Payer: Federal, State, Local not specified - PPO | Attending: Emergency Medicine | Admitting: Emergency Medicine

## 2021-11-23 ENCOUNTER — Encounter (HOSPITAL_BASED_OUTPATIENT_CLINIC_OR_DEPARTMENT_OTHER): Payer: Self-pay | Admitting: *Deleted

## 2021-11-23 ENCOUNTER — Other Ambulatory Visit: Payer: Self-pay

## 2021-11-23 DIAGNOSIS — H60501 Unspecified acute noninfective otitis externa, right ear: Secondary | ICD-10-CM | POA: Diagnosis not present

## 2021-11-23 DIAGNOSIS — H6691 Otitis media, unspecified, right ear: Secondary | ICD-10-CM | POA: Insufficient documentation

## 2021-11-23 DIAGNOSIS — H9201 Otalgia, right ear: Secondary | ICD-10-CM | POA: Diagnosis present

## 2021-11-23 DIAGNOSIS — H6591 Unspecified nonsuppurative otitis media, right ear: Secondary | ICD-10-CM

## 2021-11-23 MED ORDER — IBUPROFEN 400 MG PO TABS
400.0000 mg | ORAL_TABLET | Freq: Once | ORAL | Status: AC
  Administered 2021-11-23: 400 mg via ORAL
  Filled 2021-11-23: qty 1

## 2021-11-23 MED ORDER — OFLOXACIN 0.3 % OT SOLN: 5.0000 [drp] | Freq: Two times a day (BID) | OTIC | 0 refills | Status: AC

## 2021-11-23 NOTE — ED Provider Notes (Signed)
?[facility] EMERGENCY DEPARTMENT ?Provider Note ? ? ?CSN: [id number] ?Arrival date & time: 11/23/21  1839 ? ?  ? ?History ? ?Chief Complaint  ?Patient presents with  ? Ear Fullness  ? ? ?Jacob Frey is a 26 y.o. male.  The patient presents with a complaint of right external ear pain with a feeling of internal ear fullness.  The patient states that for the past 4 days he has an itchy ear that he has scratched and tried to clean with Q-tips.  He feels that there is fluid in the center of his ear.  He also has tenderness when touching the external ear.  He denies hearing loss.  Denies fever.  No significant PMH. ? ?HPI ? ?  ? ?Home Medications ?Prior to Admission medications   ?Medication Sig Start Date End Date Taking? Authorizing Provider  ?ofloxacin (FLOXIN) 0.3 % OTIC solution Place 5 drops into the right ear 2 (two) times daily. 11/23/21  Yes Barrie Dunker B, PA-C  ?budesonide (RHINOCORT AQUA) 32 MCG/ACT nasal spray Place 2 sprays into both nostrils daily. 11/22/18   Lawyer, Cristal Deer, PA-C  ?cetirizine (ZYRTEC ALLERGY) 10 MG tablet Take 1 tablet (10 mg total) by mouth daily. 06/24/18   Petrucelli, Pleas Koch, PA-C  ?Guaifenesin 1200 MG TB12 Take 1 tablet (1,200 mg total) by mouth 2 (two) times daily. 11/22/18   Lawyer, Cristal Deer, PA-C  ?predniSONE (DELTASONE) 50 MG tablet Take 1 tablet (50 mg total) by mouth daily. 11/22/18   Lawyer, Cristal Deer, PA-C  ?promethazine-dextromethorphan (PROMETHAZINE-DM) 6.25-15 MG/5ML syrup Take 5 mLs by mouth 4 (four) times daily as needed for cough. 11/22/18   Lawyer, Cristal Deer, PA-C  ?sodium chloride (OCEAN) 0.65 % SOLN nasal spray Place 1 spray into both nostrils as needed for congestion. 06/24/18   Petrucelli, Pleas Koch, PA-C  ?   ? ?Allergies    ?Peanut-containing drug products   ? ?Review of Systems   ?Review of Systems  ?HENT:  Positive for ear pain. Negative for ear discharge, hearing loss and tinnitus.   ? ?Physical Exam ?Updated Vital Signs ?BP (!) 124/96 (BP  Location: Right Arm)   Pulse 81   Temp 99.6 ?F (37.6 ?C) (Oral)   Resp 20   Ht 5\' 11"  (1.803 m)   Wt 113.4 kg   SpO2 99%   BMI 34.87 kg/m?  ?Physical Exam ?Constitutional:   ?   General: He is not in acute distress. ?HENT:  ?   Head: Normocephalic.  ?   Right Ear: Hearing normal. Tenderness present. A middle ear effusion is present. Tympanic membrane is not injected.  ?   Left Ear: Hearing, tympanic membrane, ear canal and external ear normal.  ?Neurological:  ?   Mental Status: He is alert.  ? ? ?ED Results / Procedures / Treatments   ?Labs ?(all labs ordered are listed, but only abnormal results are displayed) ?Labs Reviewed - No data to display ? ?EKG ?None ? ?Radiology ?No results found. ? ?Procedures ?Procedures  ? ?Medications Ordered in ED ?Medications  ?ibuprofen (ADVIL) tablet 400 mg (400 mg Oral Given 11/23/21 1904)  ? ? ?ED Course/ Medical Decision Making/ A&P ?  ?                        ?Medical Decision Making ?Risk ?Prescription drug management. ? ? ?The patient presents complaining of right ear pain. Differential diagnosis includes but is not limited to otitis media, otitis externa, tympanic membrane rupture, and others.  The ear was tender to the touch. There was an effusion without erythema noted upon exam. I believe the patient has otitis externa and otitis media with an effusion. This does not require further lab work or imaging at this time.  There is no indication for hospital admission.  I recommend discharge home with antibiotic drops for the otitis externa.  He may continue to use his nasal steroids to help with the effusion.  If this does not resolve I would recommend follow-up with primary care or ENT.  The patient understands and agrees with the plan.  Return precautions provided. ? ? ?Final Clinical Impression(s) / ED Diagnoses ?Final diagnoses:  ?Acute otitis externa of right ear, unspecified type  ?Otitis media with effusion, right  ? ? ?Rx / DC Orders ?ED Discharge Orders   ? ?       Ordered  ?  ofloxacin (FLOXIN) 0.3 % OTIC solution  2 times daily       ? 11/23/21 2030  ? ?  ?  ? ?  ? ? ?  ?Darrick Grinder, PA-C ?11/23/21 2034 ? ?  ?Rozelle Logan, DO ?11/23/21 2310 ? ?

## 2021-11-23 NOTE — Discharge Instructions (Addendum)
You were seen today for right ear pain. An effusion was noted in your right ear. This can be treated with pinching your nose while gently exhaling through the nose in order to "pop" the ear. You may also use nasal saline and continue to take your Rhinocort. If the problem persists I recommend follow up with your PCP or schedule an appointment with ENT. Return to the emergency department if you develop life threats such as chest pain, shortness of breath, or altered level of consciousness ?

## 2021-11-23 NOTE — ED Triage Notes (Signed)
Right ear fullness x 4 days.  ?

## 2022-03-31 IMAGING — DX DG KNEE COMPLETE 4+V*R*
4 series · 4 of 4 positions shown · non-contrast
Comparison: None.

CLINICAL DATA: Medial knee pain after injury during football today.

EXAM:
RIGHT KNEE - COMPLETE 4+ VIEW

[knee ap]
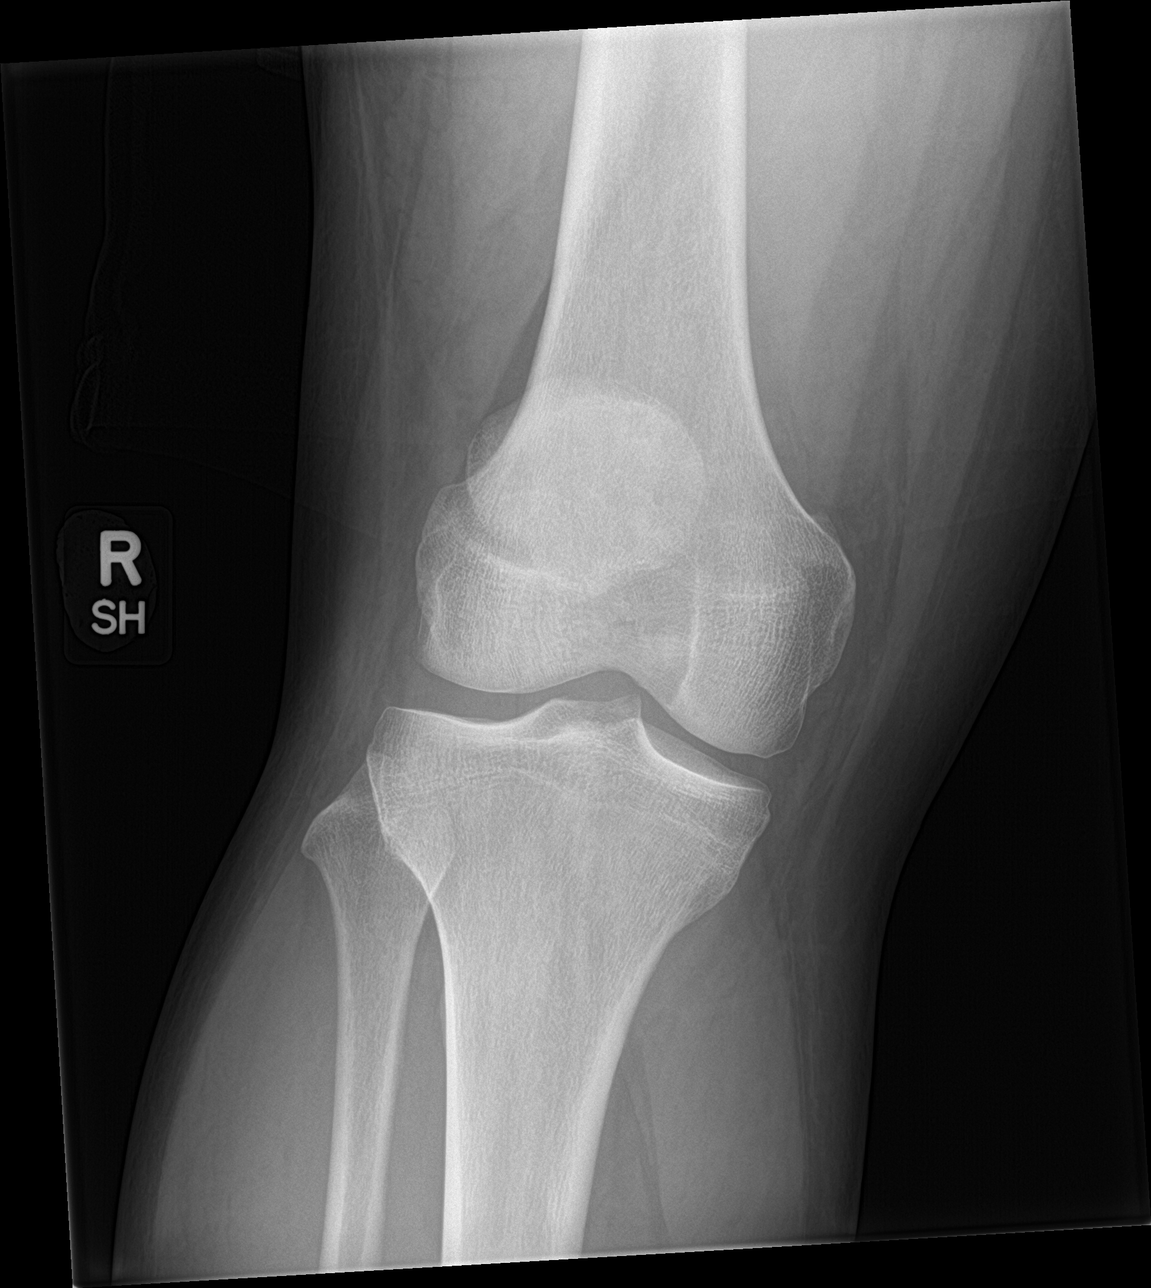

[knee lat]
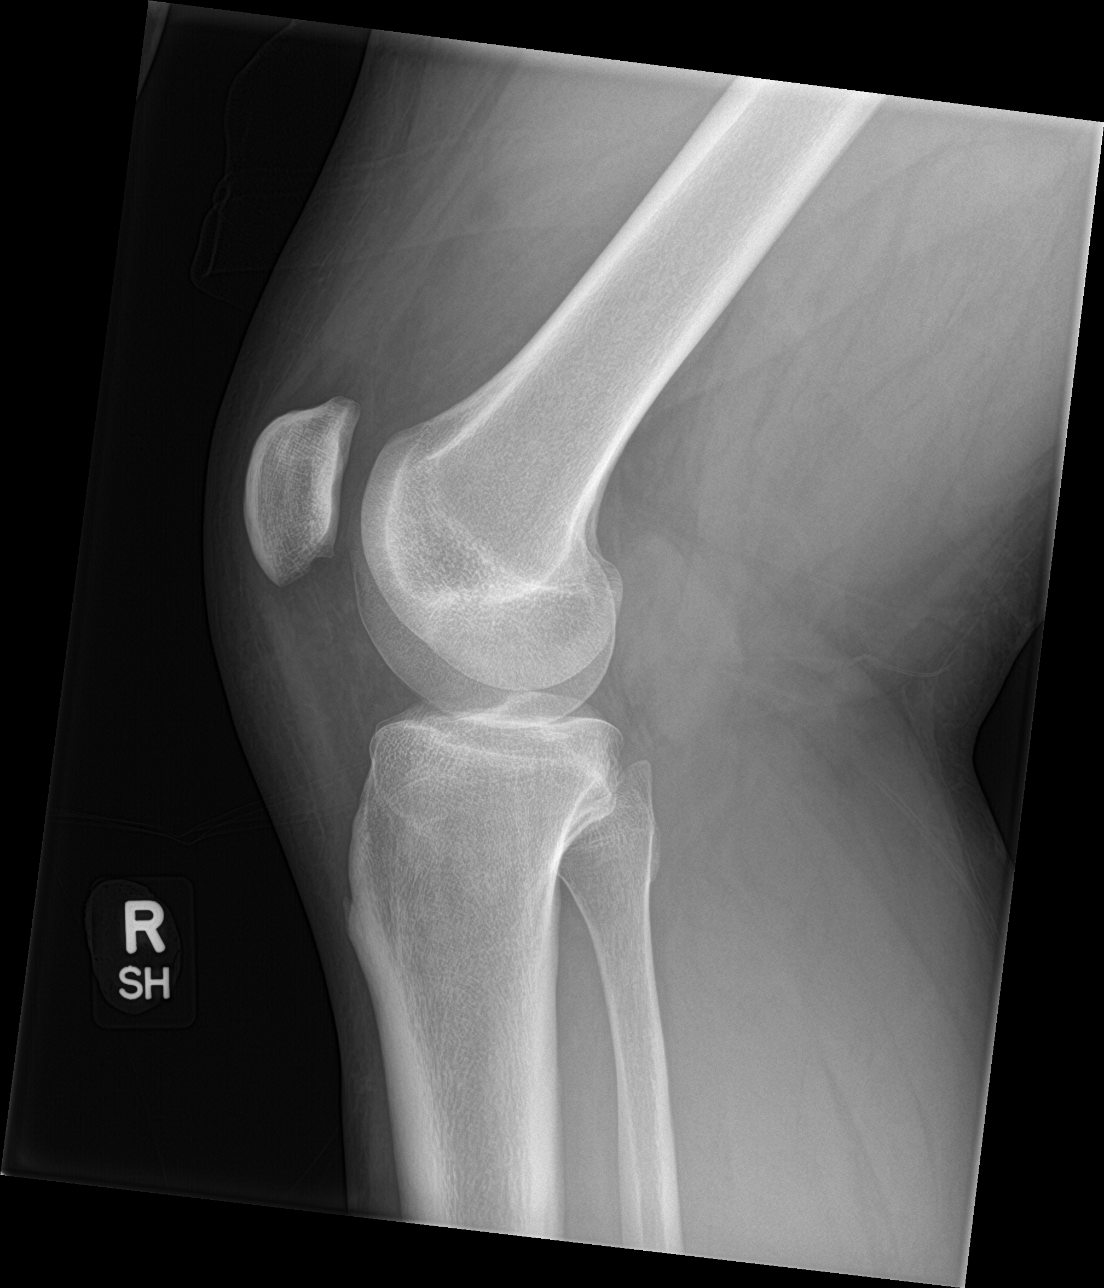

[knee obl (1 of 2)]
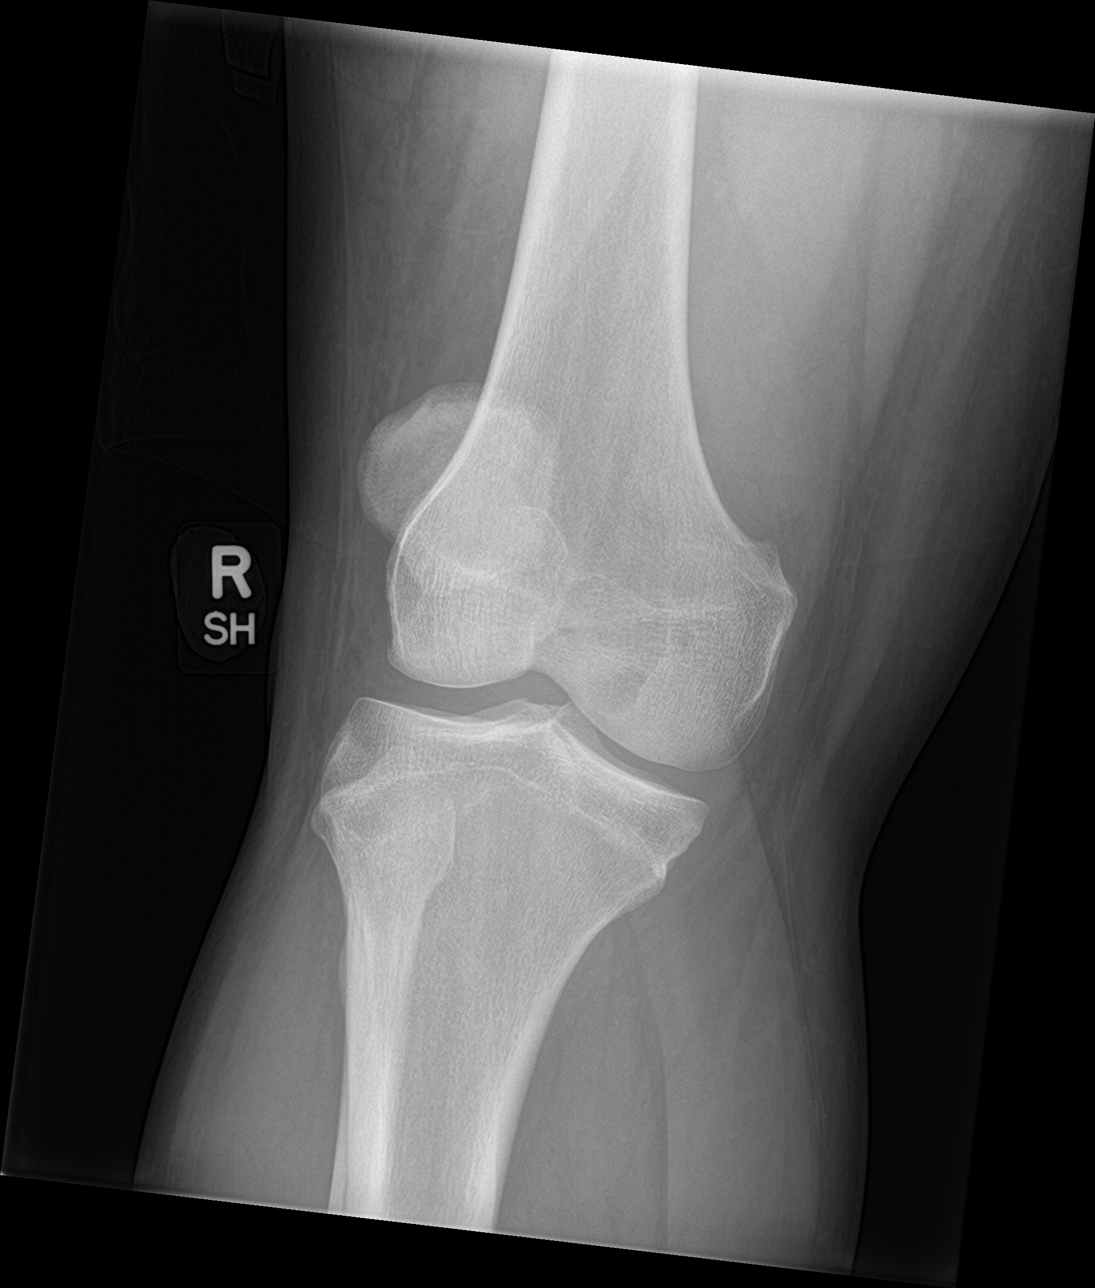

[knee obl (2 of 2)]
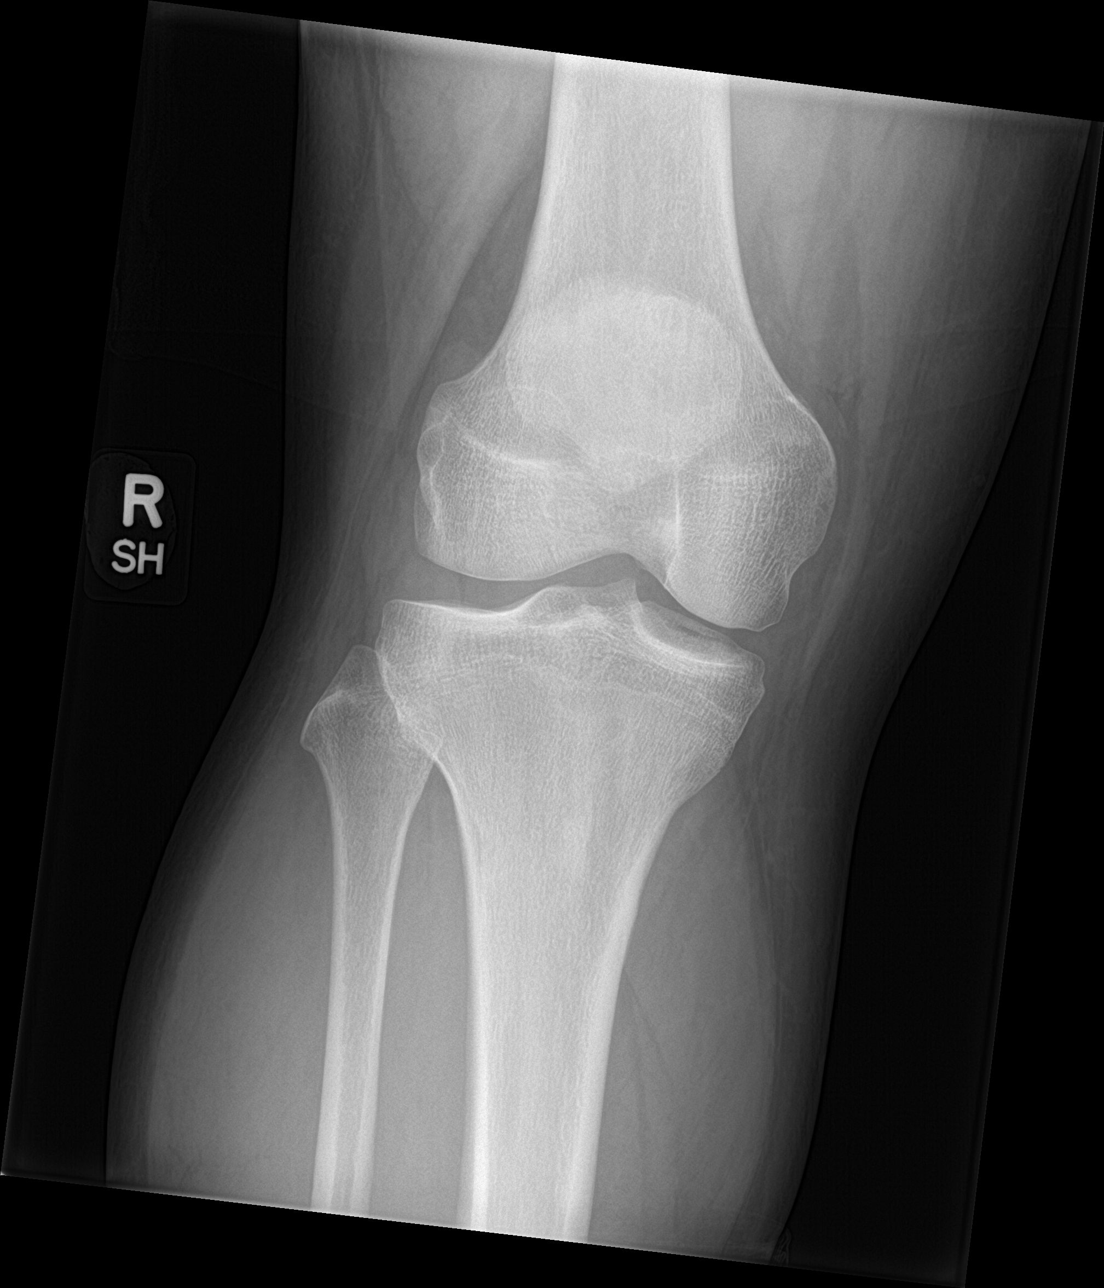

[4 of 4 positions shown; findings below may reference images not displayed]

FINDINGS: No evidence of fracture, dislocation, or joint effusion. No evidence
of arthropathy or other focal bone abnormality. Soft tissues are
unremarkable.
IMPRESSION: Negative.

## 2022-04-03 ENCOUNTER — Emergency Department (HOSPITAL_BASED_OUTPATIENT_CLINIC_OR_DEPARTMENT_OTHER)
Admission: EM | Admit: 2022-04-03 | Discharge: 2022-04-03 | Disposition: A | Discharge status: Home / Self Care | Payer: Federal, State, Local not specified - PPO | Attending: Emergency Medicine | Admitting: Emergency Medicine

## 2022-04-03 ENCOUNTER — Other Ambulatory Visit: Payer: Self-pay

## 2022-04-03 ENCOUNTER — Emergency Department (HOSPITAL_BASED_OUTPATIENT_CLINIC_OR_DEPARTMENT_OTHER): Payer: Federal, State, Local not specified - PPO

## 2022-04-03 ENCOUNTER — Encounter (HOSPITAL_BASED_OUTPATIENT_CLINIC_OR_DEPARTMENT_OTHER): Payer: Self-pay | Admitting: Emergency Medicine

## 2022-04-03 DIAGNOSIS — W1839XA Other fall on same level, initial encounter: Secondary | ICD-10-CM | POA: Insufficient documentation

## 2022-04-03 DIAGNOSIS — S62355A Nondisplaced fracture of shaft of fourth metacarpal bone, left hand, initial encounter for closed fracture: Secondary | ICD-10-CM

## 2022-04-03 DIAGNOSIS — Y9361 Activity, american tackle football: Secondary | ICD-10-CM | POA: Diagnosis not present

## 2022-04-03 DIAGNOSIS — S62323A Displaced fracture of shaft of third metacarpal bone, left hand, initial encounter for closed fracture: Secondary | ICD-10-CM | POA: Insufficient documentation

## 2022-04-03 DIAGNOSIS — Y92321 Football field as the place of occurrence of the external cause: Secondary | ICD-10-CM | POA: Diagnosis not present

## 2022-04-03 DIAGNOSIS — Z91010 Allergy to peanuts: Secondary | ICD-10-CM | POA: Insufficient documentation

## 2022-04-03 DIAGNOSIS — S6992XA Unspecified injury of left wrist, hand and finger(s), initial encounter: Secondary | ICD-10-CM | POA: Diagnosis present

## 2022-04-03 MED ORDER — IBUPROFEN 800 MG PO TABS
800.0000 mg | ORAL_TABLET | Freq: Once | ORAL | Status: AC
  Administered 2022-04-03: 800 mg via ORAL
  Filled 2022-04-03: qty 1

## 2022-04-03 MED ORDER — ACETAMINOPHEN 325 MG PO TABS
650.0000 mg | ORAL_TABLET | Freq: Once | ORAL | Status: AC
  Administered 2022-04-03: 650 mg via ORAL
  Filled 2022-04-03: qty 2

## 2022-04-03 NOTE — ED Triage Notes (Signed)
Pt arrives pov, steady gait, endorses fall while at football practice, c/o left hand pain and swelling.

## 2022-04-03 NOTE — ED Provider Notes (Signed)
[facility] EMERGENCY DEPARTMENT Provider Note   CSN: [id number] Arrival date & time: 04/03/22  1752     History  Chief Complaint  Patient presents with   Hand Injury    Jacob Frey is a 26 y.o. male no significant past medical history presents with concern for left hand pain, swelling after fall during exercise at football practice today.  Patient reports that he jammed his fingers into the ground, initially thought that they were just jammed or sprained but due to pain and swelling is concerned about fracture.   Hand Injury      Home Medications Prior to Admission medications   Medication Sig Start Date End Date Taking? Authorizing Provider  budesonide (RHINOCORT AQUA) 32 MCG/ACT nasal spray Place 2 sprays into both nostrils daily. 11/22/18   Lawyer, Cristal Deer, PA-C  cetirizine (ZYRTEC ALLERGY) 10 MG tablet Take 1 tablet (10 mg total) by mouth daily. 06/24/18   Petrucelli, Samantha R, PA-C  Guaifenesin 1200 MG TB12 Take 1 tablet (1,200 mg total) by mouth 2 (two) times daily. 11/22/18   Lawyer, Cristal Deer, PA-C  ofloxacin (FLOXIN) 0.3 % OTIC solution Place 5 drops into the right ear 2 (two) times daily. 11/23/21   Darrick Grinder, PA-C  predniSONE (DELTASONE) 50 MG tablet Take 1 tablet (50 mg total) by mouth daily. 11/22/18   Lawyer, Cristal Deer, PA-C  promethazine-dextromethorphan (PROMETHAZINE-DM) 6.25-15 MG/5ML syrup Take 5 mLs by mouth 4 (four) times daily as needed for cough. 11/22/18   Lawyer, Cristal Deer, PA-C  sodium chloride (OCEAN) 0.65 % SOLN nasal spray Place 1 spray into both nostrils as needed for congestion. 06/24/18   Petrucelli, Samantha R, PA-C      Allergies    Peanut-containing drug products    Review of Systems   Review of Systems  Musculoskeletal:  Positive for arthralgias and joint swelling.  All other systems reviewed and are negative.   Physical Exam Updated Vital Signs BP 115/76 (BP Location: Right Arm)   Pulse (!) 108   Temp 98.8 F (37.1  C) (Oral)   Resp 18   Ht 5\' 11"  (1.803 m)   Wt 111.1 kg   SpO2 98%   BMI 34.17 kg/m  Physical Exam Vitals and nursing note reviewed.  Constitutional:      General: He is not in acute distress.    Appearance: Normal appearance.  HENT:     Head: Normocephalic and atraumatic.  Eyes:     General:        Right eye: No discharge.        Left eye: No discharge.  Cardiovascular:     Rate and Rhythm: Normal rate and regular rhythm.  Pulmonary:     Effort: Pulmonary effort is normal. No respiratory distress.  Musculoskeletal:        General: No deformity.     Comments: Significant tenderness, soft tissue swelling dorsum of the left hand overlying the third and fourth MCPs.  Patient with intact flexion extension of the distal fingers, slightly decreased strength to extension but full range of motion actively and passively.  Skin:    General: Skin is warm and dry.  Neurological:     Mental Status: He is alert and oriented to person, place, and time.  Psychiatric:        Mood and Affect: Mood normal.        Behavior: Behavior normal.     ED Results / Procedures / Treatments   Labs (all labs ordered are listed, but  only abnormal results are displayed) Labs Reviewed - No data to display  EKG None  Radiology DG Hand Complete Left  Result Date: 04/03/2022 CLINICAL DATA:  Pain. EXAM: LEFT HAND - COMPLETE 3+ VIEW COMPARISON:  None Available. FINDINGS: There is a displaced fractures through the shaft of the fourth metacarpal. There is a mildly displaced fracture through the shaft of the third metacarpal. No other fractures.  No dislocations. IMPRESSION: Displaced third and fourth metacarpal shaft fractures. Electronically Signed   By: Gerome Sam III M.D.   On: 04/03/2022 18:23    Procedures Procedures    Medications Ordered in ED Medications  acetaminophen (TYLENOL) tablet 650 mg (650 mg Oral Given 04/03/22 1933)  ibuprofen (ADVIL) tablet 800 mg (800 mg Oral Given 04/03/22  1933)    ED Course/ Medical Decision Making/ A&P                           Medical Decision Making Amount and/or Complexity of Data Reviewed Radiology: ordered.  Risk OTC drugs. Prescription drug management.   There is an overall well-appearing 26 year old male who presents with concern for hand swelling, injury after football injury just prior to arrival.  Patient is neurovascularly intact on my exam with some significant swelling and soft tissue tenderness of the left hand overlying the third and fourth metacarpals. I independently interpreted imaging including film radiograph left hand which shows a partially displaced, angulated left fourth metacarpal fracture at the shaft, as well as overall nondisplaced fracture of the third metacarpal shaft on the same hand. I agree with the radiologist interpretation.  I requested consultation with the hand surgeon, spoke with Dr. Janee Morn.  We discussed patient case and reviewed radiographic imaging.  He requests volar splint of the left hand, and encourages follow-up with his office on Monday.  Splint placed, and appears appropriately positioned.  Intact capillary refill distal to splint placement.  Patient discharged in stable condition at this time, encouraged to use ibuprofen, Tylenol, rest, ice, compression, elevation.  Extensive return precautions given.   Final Clinical Impression(s) / ED Diagnoses Final diagnoses:  Closed displaced fracture of shaft of third metacarpal bone of left hand, initial encounter  Closed nondisplaced fracture of shaft of fourth metacarpal bone of left hand, initial encounter    Rx / DC Orders ED Discharge Orders     None         [place] 04/03/22 Andy Gauss, MD 04/03/22 2250

## 2022-04-03 NOTE — ED Notes (Signed)
Patient verbalizes understanding of discharge instructions. Opportunity for questioning and answers were provided. Armband removed by staff, pt discharged from ED. Ambulated out to lobby  

## 2022-04-03 NOTE — Discharge Instructions (Signed)
Please use Tylenol or ibuprofen for pain.  You may use 600 mg ibuprofen every 6 hours or 1000 mg of Tylenol every 6 hours.  You may choose to alternate between the 2.  This would be most effective.  Not to exceed 4 g of Tylenol within 24 hours.  Not to exceed 3200 mg ibuprofen 24 hours.  Recommend rest, ice, compression, and elevation of the affected extremity.  The splint will provide compression.  Please keep the splint clean and dry.  Call the orthopedic surgery office first thing on Monday morning to establish follow-up appointment.

## 2023-01-03 ENCOUNTER — Encounter: Payer: Self-pay | Admitting: *Deleted

## 2023-03-22 ENCOUNTER — Emergency Department
Admission: EM | Admit: 2023-03-22 | Discharge: 2023-03-22 | Disposition: A | Discharge status: Home / Self Care | Payer: Federal, State, Local not specified - PPO | Attending: Emergency Medicine | Admitting: Emergency Medicine

## 2023-03-22 ENCOUNTER — Other Ambulatory Visit: Payer: Self-pay

## 2023-03-22 DIAGNOSIS — K047 Periapical abscess without sinus: Secondary | ICD-10-CM

## 2023-03-22 MED ORDER — ACETAMINOPHEN 325 MG PO TABS
650.0000 mg | ORAL_TABLET | Freq: Once | ORAL | Status: AC
  Administered 2023-03-22: 650 mg via ORAL
  Filled 2023-03-22: qty 2

## 2023-03-22 MED ORDER — TRAMADOL HCL 50 MG PO TABS: 50.0000 mg | ORAL_TABLET | Freq: Four times a day (QID) | ORAL | 0 refills | Status: AC | PRN

## 2023-03-22 MED ORDER — AMOXICILLIN 500 MG PO CAPS
500.0000 mg | ORAL_CAPSULE | Freq: Once | ORAL | Status: AC
  Administered 2023-03-22: 500 mg via ORAL
  Filled 2023-03-22: qty 1

## 2023-03-22 MED ORDER — AMOXICILLIN 875 MG PO TABS: 875.0000 mg | ORAL_TABLET | Freq: Two times a day (BID) | ORAL | 0 refills | Status: AC

## 2023-03-22 NOTE — ED Provider Notes (Signed)
[facility] Provider Note    Event Date/Time   First MD Initiated Contact with Patient 03/22/23 2157     (approximate)   History   Dental Injury   HPI  Jacob Frey is a 27 y.o. male with no significant past medical history presents to the emergency department complaining of left upper tooth pain along with swelling on that side of the face that started today.  Patient has appointment with his dentist tomorrow.  States areas been painful.  Has taken 200 mg of ibuprofen 3 times today.  No fever or chills.  No chest pain or shortness of breath      Physical Exam   Triage Vital Signs: ED Triage Vitals  Enc Vitals Group     BP 03/22/23 2131 (!) 138/94     Pulse Rate 03/22/23 2131 78     Resp 03/22/23 2131 18     Temp 03/22/23 2131 99 F (37.2 C)     Temp Source 03/22/23 2131 Oral     SpO2 03/22/23 2131 100 %     Weight 03/22/23 2132 245 lb (111.1 kg)     Height 03/22/23 2132 5\' 10"  (1.778 m)     Head Circumference --      Peak Flow --      Pain Score 03/22/23 2132 9     Pain Loc --      Pain Edu? --      Excl. in GC? --     Most recent vital signs: Vitals:   03/22/23 2131  BP: (!) 138/94  Pulse: 78  Resp: 18  Temp: 99 F (37.2 C)  SpO2: 100%     General: Awake, no distress.   CV:  Good peripheral perfusion. regular rate and  rhythm Resp:  Normal effort.  Abd:  No distention.   Other:  Left cheek is swollen, fractured tooth noted, gums slightly swollen   ED Results / Procedures / Treatments   Labs (all labs ordered are listed, but only abnormal results are displayed) Labs Reviewed - No data to display   EKG     RADIOLOGY     PROCEDURES:   Procedures   MEDICATIONS ORDERED IN ED: Medications  amoxicillin (AMOXIL) capsule 500 mg (has no administration in time range)  acetaminophen (TYLENOL) tablet 650 mg (has no administration in time range)     IMPRESSION / MDM / ASSESSMENT AND PLAN / ED COURSE  I reviewed  the triage vital signs and the nursing notes.                              Differential diagnosis includes, but is not limited to, dental pain, dental abscess, cellulitis  Patient's presentation is most consistent with acute, uncomplicated illness.   Patient's exam is consistent with dental abscess.  Start him on amoxicillin and Tylenol tonight as he is driving.  He was given a prescription for amoxicillin and tramadol.  See his dentist tomorrow.  Return emergency department worsening.  Patient is in agreement treatment plan.  Discharged stable condition.      FINAL CLINICAL IMPRESSION(S) / ED DIAGNOSES   Final diagnoses:  Dental abscess     Rx / DC Orders   ED Discharge Orders          Ordered    amoxicillin (AMOXIL) 875 MG tablet  2 times daily        03/22/23 2222  traMADol (ULTRAM) 50 MG tablet  Every 6 hours PRN        03/22/23 2222             Note:  This document was prepared using Dragon voice recognition software and may include unintentional dictation errors.    Faythe Ghee, PA-C 03/22/23 2226    Sharman Cheek, MD 03/24/23 [phone]

## 2023-03-22 NOTE — ED Triage Notes (Signed)
Pt sts that he cracked an upper tooth on the left side. Pt has a dental apt tomorrow. Pt sts that he cracked the tooth last week and today he is just have a lot of pain.

## 2023-03-22 NOTE — Discharge Instructions (Signed)
Follow-up with your regular dentist tomorrow.  Take antibiotic as prescribed.  Pain medication only if Tylenol and ibuprofen are not helping.  Apply ice pack to the left cheek which will help with swelling

## 2023-10-23 ENCOUNTER — Emergency Department (HOSPITAL_BASED_OUTPATIENT_CLINIC_OR_DEPARTMENT_OTHER)
Admission: EM | Admit: 2023-10-23 | Discharge: 2023-10-23 | Disposition: A | Discharge status: Home / Self Care | Payer: Self-pay | Attending: Emergency Medicine | Admitting: Emergency Medicine

## 2023-10-23 ENCOUNTER — Emergency Department (HOSPITAL_BASED_OUTPATIENT_CLINIC_OR_DEPARTMENT_OTHER): Payer: Self-pay

## 2023-10-23 ENCOUNTER — Encounter (HOSPITAL_BASED_OUTPATIENT_CLINIC_OR_DEPARTMENT_OTHER): Payer: Self-pay | Admitting: Emergency Medicine

## 2023-10-23 ENCOUNTER — Other Ambulatory Visit: Payer: Self-pay

## 2023-10-23 DIAGNOSIS — S93402A Sprain of unspecified ligament of left ankle, initial encounter: Secondary | ICD-10-CM | POA: Insufficient documentation

## 2023-10-23 DIAGNOSIS — X509XXA Other and unspecified overexertion or strenuous movements or postures, initial encounter: Secondary | ICD-10-CM | POA: Insufficient documentation

## 2023-10-23 DIAGNOSIS — Z91010 Allergy to peanuts: Secondary | ICD-10-CM | POA: Insufficient documentation

## 2023-10-23 DIAGNOSIS — Y9361 Activity, american tackle football: Secondary | ICD-10-CM | POA: Insufficient documentation

## 2023-10-23 NOTE — Discharge Instructions (Addendum)
Please follow-up closely with orthopedics on an outpatient basis.  Return to emergency department immediately for any new or worsening symptoms.

## 2023-10-23 NOTE — ED Triage Notes (Signed)
Left ankle injury yesterday after football practice.

## 2023-10-23 NOTE — ED Provider Notes (Signed)
[place] [facility] Provider Note   CSN: [id number] Arrival date & time: 10/23/23  1003     History  Chief Complaint  Patient presents with   Ankle Injury    Paz Winsett is a 28 y.o. male.  Patient is a 28 year old male who presents to the emergency department with a chief complaint of left ankle pain.  Patient notes that he was at football practice yesterday when he felt a sudden pop to his ankle while he was running.  Patient notes that he has had swelling along the lateral malleolus as well as associated pain since that time.  He notes that he still has full dorsiflexion and plantarflexion without difficulty.  He denies any previous injuries or surgeries to the affected extremity.  He denies any other long bone or joint pain throughout.  He denies any numbness or paresthesias.   Ankle Injury       Home Medications Prior to Admission medications   Medication Sig Start Date End Date Taking? Authorizing Provider  amoxicillin (AMOXIL) 875 MG tablet Take 1 tablet (875 mg total) by mouth 2 (two) times daily. 03/22/23   Fisher, Roselyn Bering, PA-C  budesonide (RHINOCORT AQUA) 32 MCG/ACT nasal spray Place 2 sprays into both nostrils daily. 11/22/18   Lawyer, Cristal Deer, PA-C  cetirizine (ZYRTEC ALLERGY) 10 MG tablet Take 1 tablet (10 mg total) by mouth daily. 06/24/18   Petrucelli, Samantha R, PA-C  Guaifenesin 1200 MG TB12 Take 1 tablet (1,200 mg total) by mouth 2 (two) times daily. 11/22/18   Lawyer, Cristal Deer, PA-C  ofloxacin (FLOXIN) 0.3 % OTIC solution Place 5 drops into the right ear 2 (two) times daily. 11/23/21   Darrick Grinder, PA-C  predniSONE (DELTASONE) 50 MG tablet Take 1 tablet (50 mg total) by mouth daily. 11/22/18   Lawyer, Cristal Deer, PA-C  promethazine-dextromethorphan (PROMETHAZINE-DM) 6.25-15 MG/5ML syrup Take 5 mLs by mouth 4 (four) times daily as needed for cough. 11/22/18   Lawyer, Cristal Deer, PA-C  sodium chloride (OCEAN) 0.65 % SOLN  nasal spray Place 1 spray into both nostrils as needed for congestion. 06/24/18   Petrucelli, Samantha R, PA-C  traMADol (ULTRAM) 50 MG tablet Take 1 tablet (50 mg total) by mouth every 6 (six) hours as needed. 03/22/23   Faythe Ghee, PA-C      Allergies    Peanut-containing drug products    Review of Systems   Review of Systems  Musculoskeletal:  Positive for joint swelling.  All other systems reviewed and are negative.   Physical Exam Updated Vital Signs BP 119/80 (BP Location: Left Arm)   Pulse (!) 104   Temp 98.5 F (36.9 C)   Resp 18   Ht 5\' 10"  (1.778 m)   Wt 122.5 kg   SpO2 99%   BMI 38.74 kg/m  Physical Exam Constitutional:      Appearance: Normal appearance.  HENT:     Head: Normocephalic and atraumatic.  Cardiovascular:     Rate and Rhythm: Normal rate and regular rhythm.     Pulses: Normal pulses.     Heart sounds: Normal heart sounds.  Pulmonary:     Effort: Pulmonary effort is normal.     Breath sounds: Normal breath sounds.  Musculoskeletal:        General: Normal range of motion.     Cervical back: Normal range of motion and neck supple.     Comments: Tenderness palpation noted over the left lateral malleolus, nontender to palpation  of remainder of left ankle, foot, knee, hip, edema noted over the lateral malleolus, no overlying erythema or warmth, DP and PT pulse are 2+ distally, intact plantar flexion and dorsiflexion.  Plantarflexion is noted with compression of the left calf, full range of motion is noted throughout, sensation intact distally, no obvious deformity or bruising, no skin breakdown or ulceration, no lacerations or abrasions  Skin:    General: Skin is warm and dry.  Neurological:     General: No focal deficit present.     Mental Status: He is alert and oriented to person, place, and time. Mental status is at baseline.  Psychiatric:        Mood and Affect: Mood normal.        Behavior: Behavior normal.        Thought Content: Thought  content normal.        Judgment: Judgment normal.     ED Results / Procedures / Treatments   Labs (all labs ordered are listed, but only abnormal results are displayed) Labs Reviewed - No data to display  EKG None  Radiology DG Ankle Complete Left Result Date: 10/23/2023 CLINICAL DATA:  28 year old male history of posterior ankle pain after playing football. EXAM: LEFT ANKLE COMPLETE - 3+ VIEW COMPARISON:  No priors. FINDINGS: There is no evidence of fracture, dislocation, or joint effusion. There is no evidence of arthropathy or other focal bone abnormality. Soft tissues are unremarkable. IMPRESSION: Negative. Electronically Signed   By: Trudie Reed M.D.   On: 10/23/2023 10:53    Procedures Procedures    Medications Ordered in ED Medications - No data to display  ED Course/ Medical Decision Making/ A&P                                 Medical Decision Making Patient is doing well at this time and is stable for discharge home.  Discussed with patient x-rays demonstrate no signs of acute osseous injury.  Do not suspect underlying Achilles tendon injury at this point.  Suspect that he is suffering from an ankle sprain at this point.  A Velcro ankle splint was applied and did recommend close follow-up with orthopedics on outpatient basis.  He was neurovascularly intact distally.  He had no other long bone or joint pain noted on physical exam.  He had no indication midline etiology such as septic joint or gout.  Strict turn precautions were discussed for any new or worsening symptoms.  Patient voiced understanding and had no additional questions.  Amount and/or Complexity of Data Reviewed Radiology: ordered.           Final Clinical Impression(s) / ED Diagnoses Final diagnoses:  None    Rx / DC Orders ED Discharge Orders     None         Lelon Perla, PA-C 10/23/23 1155    Horton, Clabe Seal, DO 10/23/23 1530

## 2024-05-01 ENCOUNTER — Encounter (HOSPITAL_BASED_OUTPATIENT_CLINIC_OR_DEPARTMENT_OTHER): Payer: Self-pay | Admitting: Urology

## 2024-05-01 ENCOUNTER — Other Ambulatory Visit: Payer: Self-pay

## 2024-05-01 ENCOUNTER — Emergency Department (HOSPITAL_BASED_OUTPATIENT_CLINIC_OR_DEPARTMENT_OTHER)
Admission: EM | Admit: 2024-05-01 | Discharge: 2024-05-01 | Disposition: A | Discharge status: Home / Self Care | Payer: Self-pay

## 2024-05-01 DIAGNOSIS — R300 Dysuria: Secondary | ICD-10-CM | POA: Insufficient documentation

## 2024-05-01 DIAGNOSIS — Z91010 Allergy to peanuts: Secondary | ICD-10-CM | POA: Insufficient documentation

## 2024-05-01 DIAGNOSIS — Z202 Contact with and (suspected) exposure to infections with a predominantly sexual mode of transmission: Secondary | ICD-10-CM | POA: Insufficient documentation

## 2024-05-01 LAB — URINALYSIS, ROUTINE W REFLEX MICROSCOPIC
Bilirubin Urine: NEGATIVE
Glucose, UA: NEGATIVE mg/dL
Hgb urine dipstick: NEGATIVE
Ketones, ur: NEGATIVE mg/dL
Leukocytes,Ua: NEGATIVE
Nitrite: NEGATIVE
Protein, ur: NEGATIVE mg/dL
Specific Gravity, Urine: 1.02 (ref 1.005–1.030)
pH: 7 (ref 5.0–8.0)

## 2024-05-01 MED ORDER — LIDOCAINE HCL (PF) 1 % IJ SOLN
INTRAMUSCULAR | Status: AC
Start: 2024-05-01 — End: 2024-05-01
  Administered 2024-05-01 (×2): 1 mL
  Filled 2024-05-01: qty 5

## 2024-05-01 MED ORDER — DOXYCYCLINE HYCLATE 100 MG PO CAPS: 100.0000 mg | ORAL_CAPSULE | Freq: Two times a day (BID) | ORAL | 0 refills | Status: DC

## 2024-05-01 MED ORDER — CEFTRIAXONE SODIUM 500 MG IJ SOLR
500.0000 mg | Freq: Once | INTRAMUSCULAR | Status: AC
  Administered 2024-05-01 (×2): 500 mg via INTRAMUSCULAR
  Filled 2024-05-01: qty 500

## 2024-05-01 NOTE — ED Triage Notes (Signed)
 Pt states had unprotected sex  Not having any symptoms just wants to be sure he doesn't have an std

## 2024-05-01 NOTE — Discharge Instructions (Addendum)
 As discussed, your urine testing today looked normal.  Follow MyChart for the results of your STD testing.  Will treat you empirically with Rocephin  as well as doxycycline  for treatment of gonorrhea and chlamydia.  If you test negative for chlamydia, you may discontinue the doxycycline  but continue the complete course if you test positive.  Recommend abstaining from sexual intercourse until completion of antibiotics if you test positive.

## 2024-05-01 NOTE — ED Provider Notes (Signed)
 [place] [facility] Provider Note   CSN: [id number] Arrival date & time: 05/01/24  1430     Patient presents with: Exposure to STD   Jacob Frey is a 28 y.o. male.    Exposure to STD   28 year old male presents emergency department concern for STD exposure.  Was sexually active with an individual on Sunday who was sexually active with another individual that may have tested positive for STD.  Patient states that he has been having slight burning when urinating over the past couple of days.  States that this is very brief only happens for a second or 2 when he experiences this.  Denies any fever, penile discharge, testicular pain, rash.  Requesting empiric testing and treatment.  Denies any fever, abdominal pain.  No significant pertinent past medical history.  Prior to Admission medications   Medication Sig Start Date End Date Taking? Authorizing Provider  doxycycline  (VIBRAMYCIN ) 100 MG capsule Take 1 capsule (100 mg total) by mouth 2 (two) times daily. 05/01/24  Yes Silver Fell A, PA  amoxicillin  (AMOXIL ) 875 MG tablet Take 1 tablet (875 mg total) by mouth 2 (two) times daily. 03/22/23   Fisher, Devere ORN, PA-C  budesonide  (RHINOCORT  AQUA) 32 MCG/ACT nasal spray Place 2 sprays into both nostrils daily. 11/22/18   Lawyer, Lonni, PA-C  cetirizine  (ZYRTEC  ALLERGY) 10 MG tablet Take 1 tablet (10 mg total) by mouth daily. 06/24/18   Petrucelli, Samantha R, PA-C  Guaifenesin  1200 MG TB12 Take 1 tablet (1,200 mg total) by mouth 2 (two) times daily. 11/22/18   Lawyer, Lonni, PA-C  ofloxacin  (FLOXIN ) 0.3 % OTIC solution Place 5 drops into the right ear 2 (two) times daily. 11/23/21   Logan Ubaldo NOVAK, PA-C  predniSONE  (DELTASONE ) 50 MG tablet Take 1 tablet (50 mg total) by mouth daily. 11/22/18   Lawyer, Lonni, PA-C  promethazine -dextromethorphan (PROMETHAZINE -DM) 6.25-15 MG/5ML syrup Take 5 mLs by mouth 4 (four) times daily as needed for cough.  11/22/18   Lawyer, Lonni, PA-C  sodium chloride (OCEAN) 0.65 % SOLN nasal spray Place 1 spray into both nostrils as needed for congestion. 06/24/18   Petrucelli, Samantha R, PA-C  traMADol  (ULTRAM ) 50 MG tablet Take 1 tablet (50 mg total) by mouth every 6 (six) hours as needed. 03/22/23   Gasper Devere ORN, PA-C    Allergies: Peanut-containing drug products    Review of Systems  All other systems reviewed and are negative.   Updated Vital Signs BP (!) 139/96 (BP Location: Left Arm)   Pulse 86   Temp 99.1 F (37.3 C) (Oral)   Resp 16   Ht 5' 10 (1.778 m)   Wt 122.5 kg   SpO2 100%   BMI 38.75 kg/m   Physical Exam Vitals and nursing note reviewed.  Constitutional:      General: He is not in acute distress.    Appearance: He is well-developed.  HENT:     Head: Normocephalic and atraumatic.  Eyes:     Conjunctiva/sclera: Conjunctivae normal.  Cardiovascular:     Rate and Rhythm: Normal rate and regular rhythm.     Heart sounds: No murmur heard. Pulmonary:     Effort: Pulmonary effort is normal. No respiratory distress.     Breath sounds: Normal breath sounds.  Abdominal:     Palpations: Abdomen is soft.     Tenderness: There is no abdominal tenderness. There is no guarding.  Musculoskeletal:        General:  No swelling.     Cervical back: Neck supple.  Skin:    General: Skin is warm and dry.     Capillary Refill: Capillary refill takes less than 2 seconds.  Neurological:     Mental Status: He is alert.  Psychiatric:        Mood and Affect: Mood normal.     (all labs ordered are listed, but only abnormal results are displayed) Labs Reviewed  URINALYSIS, ROUTINE W REFLEX MICROSCOPIC  GC/CHLAMYDIA PROBE AMP ([place]) NOT AT [facility]    EKG: None  Radiology: No results found.   Procedures   Medications Ordered in the ED  cefTRIAXone  (ROCEPHIN ) injection 500 mg (has no administration in time range)                                    Medical Decision  Making Amount and/or Complexity of Data Reviewed Labs: ordered.  Risk Prescription drug management.   This patient presents to the ED for concern of STD exposure, this involves an extensive number of treatment options, and is a complaint that carries with it a high risk of complications and morbidity.  The differential diagnosis includes gonorrhea, chlamydia, HIV, syphilis, UTI, other   Co morbidities that complicate the patient evaluation  See HPI   Additional history obtained:  Additional history obtained from EMR External records from outside source obtained and reviewed including hospital records   Lab Tests:  I Ordered, and personally interpreted labs.  The pertinent results include: UA normal.  Pending GC/chlamydia.   Imaging Studies ordered:  N/a   Cardiac Monitoring: / EKG:  N/a   Consultations Obtained:  N/a   Problem List / ED Course / Critical interventions / Medication management  Possible exposure to STD, dysuria Ordered Rocephin . Reevaluation of the patient after these medicines showed that the patient stayed the same I have reviewed the patients home medicines and have made adjustments as needed   Social Determinants of Health:  Denies tobacco, licit drug use.   Test / Admission - Considered:  Possible exposure to STD, dysuria Vitals signs  within normal range and stable throughout visit. Laboratory/imaging studies significant for: See above 28 year old male presents emergency department concern for STD exposure.  Was sexually active with an individual on Sunday who was sexually active with another individual that may have tested positive for STD.  Patient states that he has been having slight burning when urinating over the past couple of days.  States that this is very brief only happens for a second or 2 when he experiences this.  Denies any fever, penile discharge, testicular pain, rash.  Requesting empiric testing and treatment.  Denies  any fever, abdominal pain. On exam, no abdominal tenderness.  Lungs clear to auscultation bilaterally.  UA without abnormality.  Gonorrhea/chlamydia testing pending.  Patient declined any further STD testing beyond this.  Patient elected for empiric treatment given he is currently symptomatic.  Will give IM dose of Rocephin  in the ED and sent home with doxycycline .  Recommend following MyChart for results.  Follow-up with primary care recommended for reevaluation.  Treatment plan discussed with patient and he was understanding was agreeable to said plan.  Patient pain, afebrile in no acute distress. Worrisome signs and symptoms were discussed with the patient, and the patient acknowledged understanding to return to the ED if noticed. Patient was stable upon discharge.  Final diagnoses:  Possible exposure to STD  Dysuria    ED Discharge Orders          Ordered    doxycycline  (VIBRAMYCIN ) 100 MG capsule  2 times daily        05/01/24 1522               Silver Wonda LABOR, [place] 05/01/24 1620    Elnor Savant A, DO 05/02/24 1614

## 2024-05-01 NOTE — ED Notes (Signed)
 Pt d/c home per EDP order. Discharge summary reviewed, verbalize understanding. Ambulatory NAD.

## 2024-05-02 LAB — GC/CHLAMYDIA PROBE AMP (~~LOC~~) NOT AT ARMC
Chlamydia: NEGATIVE
Comment: NEGATIVE
Comment: NORMAL
Neisseria Gonorrhea: NEGATIVE

## 2024-09-24 ENCOUNTER — Emergency Department
Admission: EM | Admit: 2024-09-24 | Discharge: 2024-09-24 | Disposition: A | Discharge status: Home / Self Care | Payer: Self-pay | Attending: Emergency Medicine | Admitting: Emergency Medicine

## 2024-09-24 ENCOUNTER — Other Ambulatory Visit: Payer: Self-pay

## 2024-09-24 DIAGNOSIS — L739 Follicular disorder, unspecified: Secondary | ICD-10-CM | POA: Insufficient documentation

## 2024-09-24 DIAGNOSIS — R220 Localized swelling, mass and lump, head: Secondary | ICD-10-CM

## 2024-09-24 MED ORDER — CEPHALEXIN 500 MG PO CAPS: 500.0000 mg | ORAL_CAPSULE | Freq: Three times a day (TID) | ORAL | 0 refills | Status: AC

## 2024-09-24 MED ORDER — DOXYCYCLINE HYCLATE 100 MG PO TABS: 100.0000 mg | ORAL_TABLET | Freq: Two times a day (BID) | ORAL | 0 refills | Status: AC

## 2024-09-24 NOTE — Discharge Instructions (Addendum)
 Do warm compresses over area. We discussed attempt at drainage with needle aspiration but given how small this is we are going to try antibiotics first but if you develop worsening symptoms you should return to the ER for repeat evaluation such as worsening swelling, fevers, any other concerns.

## 2024-09-24 NOTE — ED Triage Notes (Signed)
 Pt reports concern for left nostril pain and swelling. Tenderness to touch.

## 2024-09-24 NOTE — ED Provider Notes (Signed)
 "  [facility] Provider Note    Event Date/Time   First MD Initiated Contact with Patient 09/24/24 [phone]     (approximate)   History   Facial Pain   HPI  Jacob Frey is a 29 y.o. male who is otherwise healthy who is not on any current medications who comes in with concerns for left nostril pain.  Patient reports having a whitehead on his nostril 2 days ago.  He did not pop it but he noticed this morning that it was a Krantz bit more swollen.  He denies any fevers, difficulty swallowing, pain in his teeth, vision changes, pain with eye movements, headaches or any other concerns.  He does report a history of an abscess on his chin I reviewed the records from 08/21/2021 where he had a larger facial abscess requiring I&D\ but pt denies they were able to get anything out but symptoms improved with doxy.    Physical Exam   Triage Vital Signs: ED Triage Vitals [09/24/24 0554]  Encounter Vitals Group     BP (!) 137/94     Girls Systolic BP Percentile      Girls Diastolic BP Percentile      Boys Systolic BP Percentile      Boys Diastolic BP Percentile      Pulse Rate 73     Resp 16     Temp 98.8 F (37.1 C)     Temp Source Oral     SpO2 99 %     Weight      Height      Head Circumference      Peak Flow      Pain Score 8     Pain Loc      Pain Education      Exclude from Growth Chart     Most recent vital signs: Vitals:   09/24/24 0554  BP: (!) 137/94  Pulse: 73  Resp: 16  Temp: 98.8 F (37.1 C)  SpO2: 99%     General: Awake, no distress.  CV:  Good peripheral perfusion.  Resp:  Normal effort.  Abd:  No distention.  Other:  Extraocular movements are intact.  Vision is normal.  Patient has small bump nonfluctuant in nature noted inside the left lower nares with some very minimal swelling.  Patient has no crepitus noted.  There is no fluctuation noted.  OP is normal..  Teeth were palpated without any pain or swelling noted intraorally.   ED  Results / Procedures / Treatments   Labs (all labs ordered are listed, but only abnormal results are displayed) Labs Reviewed - No data to display   EKG  My interpretation of EKG:    RADIOLOGY I have reviewed the xray personally and interpreted    PROCEDURES:  Critical Care performed: No  Procedures   MEDICATIONS ORDERED IN ED: Medications - No data to display   IMPRESSION / MDM / ASSESSMENT AND PLAN / ED COURSE  I reviewed the triage vital signs and the nursing notes.   Patient's presentation is most consistent with acute, uncomplicated illness.  He is otherwise very well-appearing does not appear septic. Patient with some slight swelling noted to the internal nares on the lower aspect. Seems like folliculitis. No fluctuation felt.  Discussed CT imaging but at this time I do not feel he there would be a large abscess and patient has no symptoms to suggest venous thrombus orbital cellulitis or other acute pathology.  We  did discuss return precautions to the above and patient agreeable to holding off on CT imaging but understands when to return.    We discussed attempt at needle aspiration but he stated that last time they did that nothing came out and he states that this is not nearly as bad as it was that time and that he was hoping to just get put on antibiotics given that is what helped improve it last time when he something.  I do suspect this is more likely an infected hair and I do not really feel an obvious fluctuation that I think I would get a lot of pus out and patient would prefer trial of antibiotics and returning if he develops changes in symptoms or worsening symptoms.     FINAL CLINICAL IMPRESSION(S) / ED DIAGNOSES   Final diagnoses:  Nasal swelling  Folliculitis     Rx / DC Orders   ED Discharge Orders          Ordered    doxycycline  (VIBRA -TABS) 100 MG tablet  2 times daily        09/24/24 0638    cephALEXin  (KEFLEX ) 500 MG capsule  3 times daily         09/24/24 9361             Note:  This document was prepared using Dragon voice recognition software and may include unintentional dictation errors.   Ernest Ronal BRAVO, MD 09/24/24 [phone]  "
